# Patient Record
Sex: Male | Born: 1965 | Hispanic: No | Marital: Married | Smoking: Never smoker
Health system: Southern US, Community
[De-identification: ages and names within clinical notes are randomized; demographics above are authoritative.]

## PROBLEM LIST (undated history)

## (undated) DIAGNOSIS — E7800 Pure hypercholesterolemia, unspecified: Secondary | ICD-10-CM

## (undated) DIAGNOSIS — I219 Acute myocardial infarction, unspecified: Secondary | ICD-10-CM

## (undated) HISTORY — PX: CYST EXCISION: SHX5701

## (undated) HISTORY — PX: FINGER SURGERY: SHX640

---

## 2017-05-07 ENCOUNTER — Inpatient Hospital Stay (HOSPITAL_COMMUNITY)
Admission: EM | Admit: 2017-05-07 | Discharge: 2017-05-13 | DRG: 603 | Disposition: A | Discharge status: Home / Self Care | Payer: PRIVATE HEALTH INSURANCE | Attending: Family Medicine | Admitting: Family Medicine

## 2017-05-07 ENCOUNTER — Encounter (HOSPITAL_COMMUNITY): Payer: Self-pay | Admitting: *Deleted

## 2017-05-07 ENCOUNTER — Inpatient Hospital Stay (HOSPITAL_COMMUNITY): Payer: Self-pay

## 2017-05-07 DIAGNOSIS — L03116 Cellulitis of left lower limb: Principal | ICD-10-CM

## 2017-05-07 DIAGNOSIS — E669 Obesity, unspecified: Secondary | ICD-10-CM | POA: Diagnosis present

## 2017-05-07 DIAGNOSIS — E785 Hyperlipidemia, unspecified: Secondary | ICD-10-CM | POA: Diagnosis present

## 2017-05-07 DIAGNOSIS — R000 Tachycardia, unspecified: Secondary | ICD-10-CM | POA: Diagnosis not present

## 2017-05-07 DIAGNOSIS — R509 Fever, unspecified: Secondary | ICD-10-CM | POA: Diagnosis present

## 2017-05-07 DIAGNOSIS — I252 Old myocardial infarction: Secondary | ICD-10-CM

## 2017-05-07 DIAGNOSIS — Z7984 Long term (current) use of oral hypoglycemic drugs: Secondary | ICD-10-CM

## 2017-05-07 DIAGNOSIS — R600 Localized edema: Secondary | ICD-10-CM

## 2017-05-07 DIAGNOSIS — I2510 Atherosclerotic heart disease of native coronary artery without angina pectoris: Secondary | ICD-10-CM | POA: Diagnosis not present

## 2017-05-07 DIAGNOSIS — L03119 Cellulitis of unspecified part of limb: Secondary | ICD-10-CM | POA: Diagnosis present

## 2017-05-07 DIAGNOSIS — Z6841 Body Mass Index (BMI) 40.0 and over, adult: Secondary | ICD-10-CM

## 2017-05-07 DIAGNOSIS — M545 Low back pain: Secondary | ICD-10-CM | POA: Diagnosis present

## 2017-05-07 DIAGNOSIS — E119 Type 2 diabetes mellitus without complications: Secondary | ICD-10-CM | POA: Diagnosis present

## 2017-05-07 DIAGNOSIS — Z79899 Other long term (current) drug therapy: Secondary | ICD-10-CM

## 2017-05-07 DIAGNOSIS — M79605 Pain in left leg: Secondary | ICD-10-CM

## 2017-05-07 HISTORY — DX: Pure hypercholesterolemia, unspecified: E78.00

## 2017-05-07 HISTORY — DX: Acute myocardial infarction, unspecified: I21.9

## 2017-05-07 LAB — CBC WITH DIFFERENTIAL/PLATELET
Basophils Absolute: 0 10*3/uL (ref 0.0–0.1)
Basophils Relative: 0 %
EOS ABS: 0 10*3/uL (ref 0.0–0.7)
Eosinophils Relative: 0 %
HEMATOCRIT: 39.3 % (ref 39.0–52.0)
HEMOGLOBIN: 13.2 g/dL (ref 13.0–17.0)
LYMPHS ABS: 0.8 10*3/uL (ref 0.7–4.0)
Lymphocytes Relative: 8 %
MCH: 28.8 pg (ref 26.0–34.0)
MCHC: 33.6 g/dL (ref 30.0–36.0)
MCV: 85.6 fL (ref 78.0–100.0)
MONO ABS: 0.4 10*3/uL (ref 0.1–1.0)
MONOS PCT: 4 %
NEUTROS PCT: 88 %
Neutro Abs: 9.3 10*3/uL — ABNORMAL HIGH (ref 1.7–7.7)
Platelets: 151 10*3/uL (ref 150–400)
RBC: 4.59 MIL/uL (ref 4.22–5.81)
RDW: 13.9 % (ref 11.5–15.5)
WBC: 10.5 10*3/uL (ref 4.0–10.5)

## 2017-05-07 LAB — COMPREHENSIVE METABOLIC PANEL
ALK PHOS: 60 U/L (ref 38–126)
ALT: 40 U/L (ref 17–63)
ANION GAP: 9 (ref 5–15)
AST: 42 U/L — ABNORMAL HIGH (ref 15–41)
Albumin: 3.2 g/dL — ABNORMAL LOW (ref 3.5–5.0)
BILIRUBIN TOTAL: 1.2 mg/dL (ref 0.3–1.2)
BUN: 15 mg/dL (ref 6–20)
CALCIUM: 8.2 mg/dL — AB (ref 8.9–10.3)
CO2: 25 mmol/L (ref 22–32)
Chloride: 99 mmol/L — ABNORMAL LOW (ref 101–111)
Creatinine, Ser: 1.02 mg/dL (ref 0.61–1.24)
GFR calc non Af Amer: >60 mL/min (ref >60)
Glucose, Bld: 136 mg/dL — ABNORMAL HIGH (ref 65–99)
POTASSIUM: 3.5 mmol/L (ref 3.5–5.1)
SODIUM: 133 mmol/L — AB (ref 135–145)
TOTAL PROTEIN: 7.1 g/dL (ref 6.5–8.1)

## 2017-05-07 LAB — GLUCOSE, CAPILLARY
Glucose-Capillary: 111 mg/dL — ABNORMAL HIGH (ref 65–99)
Glucose-Capillary: 124 mg/dL — ABNORMAL HIGH (ref 65–99)

## 2017-05-07 LAB — LACTIC ACID, PLASMA: Lactic Acid, Venous: 1.6 mmol/L (ref 0.5–1.9)

## 2017-05-07 LAB — D-DIMER, QUANTITATIVE: D-Dimer, Quant: 3 ug/mL-FEU — ABNORMAL HIGH (ref 0.00–0.50)

## 2017-05-07 MED ORDER — DOCUSATE SODIUM 100 MG PO CAPS
100.0000 mg | ORAL_CAPSULE | Freq: Two times a day (BID) | ORAL | Status: DC
  Administered 2017-05-07 – 2017-05-13 (×12): 100 mg via ORAL
  Filled 2017-05-07 (×12): qty 1

## 2017-05-07 MED ORDER — POTASSIUM CHLORIDE CRYS ER 20 MEQ PO TBCR
20.0000 meq | EXTENDED_RELEASE_TABLET | Freq: Two times a day (BID) | ORAL | Status: DC
  Administered 2017-05-07 – 2017-05-11 (×8): 20 meq via ORAL
  Filled 2017-05-07 (×9): qty 1

## 2017-05-07 MED ORDER — ACETAMINOPHEN 325 MG PO TABS
650.0000 mg | ORAL_TABLET | Freq: Four times a day (QID) | ORAL | Status: DC | PRN
Start: 2017-05-07 — End: 2017-05-13

## 2017-05-07 MED ORDER — ACETAMINOPHEN 325 MG PO TABS
650.0000 mg | ORAL_TABLET | Freq: Once | ORAL | Status: AC
  Administered 2017-05-07: 650 mg via ORAL
  Filled 2017-05-07: qty 2

## 2017-05-07 MED ORDER — INSULIN ASPART 100 UNIT/ML ~~LOC~~ SOLN
0.0000 [IU] | Freq: Three times a day (TID) | SUBCUTANEOUS | Status: DC
  Administered 2017-05-08 – 2017-05-11 (×10): 3 [IU] via SUBCUTANEOUS
  Administered 2017-05-11 – 2017-05-12 (×2): 4 [IU] via SUBCUTANEOUS
  Administered 2017-05-12: 3 [IU] via SUBCUTANEOUS

## 2017-05-07 MED ORDER — FUROSEMIDE 20 MG PO TABS
20.0000 mg | ORAL_TABLET | Freq: Two times a day (BID) | ORAL | Status: DC
  Administered 2017-05-08 – 2017-05-11 (×8): 20 mg via ORAL
  Filled 2017-05-07 (×8): qty 1

## 2017-05-07 MED ORDER — PANTOPRAZOLE SODIUM 40 MG PO TBEC
40.0000 mg | DELAYED_RELEASE_TABLET | Freq: Two times a day (BID) | ORAL | Status: DC
  Administered 2017-05-07 – 2017-05-13 (×12): 40 mg via ORAL
  Filled 2017-05-07 (×12): qty 1

## 2017-05-07 MED ORDER — OXYCODONE HCL 5 MG PO TABS
10.0000 mg | ORAL_TABLET | Freq: Once | ORAL | Status: AC
  Administered 2017-05-07: 10 mg via ORAL
  Filled 2017-05-07: qty 2

## 2017-05-07 MED ORDER — METOLAZONE 5 MG PO TABS
10.0000 mg | ORAL_TABLET | ORAL | Status: DC
  Administered 2017-05-07 – 2017-05-12 (×3): 10 mg via ORAL
  Filled 2017-05-07 (×3): qty 2
  Filled 2017-05-07: qty 1
  Filled 2017-05-07: qty 2

## 2017-05-07 MED ORDER — ONDANSETRON HCL 4 MG PO TABS: 4.0000 mg | ORAL_TABLET | Freq: Four times a day (QID) | ORAL | Status: DC | PRN

## 2017-05-07 MED ORDER — ACETAMINOPHEN 650 MG RE SUPP: 650.0000 mg | Freq: Four times a day (QID) | RECTAL | Status: DC | PRN

## 2017-05-07 MED ORDER — INSULIN ASPART 100 UNIT/ML ~~LOC~~ SOLN
6.0000 [IU] | Freq: Three times a day (TID) | SUBCUTANEOUS | Status: DC
Start: 2017-05-07 — End: 2017-05-13
  Administered 2017-05-07 – 2017-05-13 (×17): 6 [IU] via SUBCUTANEOUS

## 2017-05-07 MED ORDER — ENOXAPARIN SODIUM 80 MG/0.8ML ~~LOC~~ SOLN
80.0000 mg | SUBCUTANEOUS | Status: DC
  Administered 2017-05-07 – 2017-05-11 (×5): 80 mg via SUBCUTANEOUS
  Filled 2017-05-07 (×5): qty 0.8

## 2017-05-07 MED ORDER — CEFAZOLIN SODIUM-DEXTROSE 2-4 GM/100ML-% IV SOLN
2.0000 g | Freq: Three times a day (TID) | INTRAVENOUS | Status: DC
  Administered 2017-05-07 – 2017-05-08 (×2): 2 g via INTRAVENOUS
  Filled 2017-05-07 (×3): qty 100

## 2017-05-07 MED ORDER — HYDROCODONE-ACETAMINOPHEN 5-325 MG PO TABS
1.0000 | ORAL_TABLET | ORAL | Status: DC | PRN
  Administered 2017-05-08 – 2017-05-11 (×11): 1 via ORAL
  Filled 2017-05-07 (×10): qty 1
  Filled 2017-05-07: qty 2

## 2017-05-07 MED ORDER — NAPROXEN 250 MG PO TABS
500.0000 mg | ORAL_TABLET | Freq: Two times a day (BID) | ORAL | Status: AC
  Administered 2017-05-07 – 2017-05-10 (×6): 500 mg via ORAL
  Filled 2017-05-07 (×6): qty 2

## 2017-05-07 MED ORDER — PRAVASTATIN SODIUM 40 MG PO TABS
20.0000 mg | ORAL_TABLET | Freq: Every day | ORAL | Status: DC
  Administered 2017-05-07 – 2017-05-12 (×6): 20 mg via ORAL
  Filled 2017-05-07 (×6): qty 1

## 2017-05-07 MED ORDER — LIDOCAINE 5 % EX PTCH
2.0000 | MEDICATED_PATCH | CUTANEOUS | Status: DC
  Filled 2017-05-07: qty 2

## 2017-05-07 MED ORDER — VANCOMYCIN HCL IN DEXTROSE 1-5 GM/200ML-% IV SOLN
1000.0000 mg | Freq: Once | INTRAVENOUS | Status: AC
  Administered 2017-05-07: 1000 mg via INTRAVENOUS
  Filled 2017-05-07: qty 200

## 2017-05-07 MED ORDER — SODIUM CHLORIDE 0.9% FLUSH
3.0000 mL | Freq: Two times a day (BID) | INTRAVENOUS | Status: DC
  Administered 2017-05-08 – 2017-05-12 (×10): 3 mL via INTRAVENOUS

## 2017-05-07 MED ORDER — ONDANSETRON HCL 4 MG/2ML IJ SOLN: 4.0000 mg | Freq: Four times a day (QID) | INTRAMUSCULAR | Status: DC | PRN

## 2017-05-07 MED ORDER — SODIUM CHLORIDE 0.9% FLUSH: 3.0000 mL | INTRAVENOUS | Status: DC | PRN

## 2017-05-07 MED ORDER — SODIUM CHLORIDE 0.9 % IV BOLUS (SEPSIS)
1000.0000 mL | Freq: Once | INTRAVENOUS | Status: AC
  Administered 2017-05-07: 1000 mL via INTRAVENOUS

## 2017-05-07 MED ORDER — SODIUM CHLORIDE 0.9 % IV SOLN
250.0000 mL | INTRAVENOUS | Status: DC | PRN
  Administered 2017-05-08: 250 mL via INTRAVENOUS

## 2017-05-07 MED ORDER — LISINOPRIL 5 MG PO TABS
5.0000 mg | ORAL_TABLET | Freq: Every day | ORAL | Status: DC
  Administered 2017-05-08 – 2017-05-13 (×6): 5 mg via ORAL
  Filled 2017-05-07 (×6): qty 1

## 2017-05-07 MED ORDER — FUROSEMIDE 40 MG PO TABS: 20.0000 mg | ORAL_TABLET | Freq: Two times a day (BID) | ORAL | Status: DC

## 2017-05-07 MED ORDER — ZOLPIDEM TARTRATE 5 MG PO TABS: 5.0000 mg | ORAL_TABLET | Freq: Every evening | ORAL | Status: DC | PRN

## 2017-05-07 MED ORDER — LIDOCAINE 5 % EX PTCH
2.0000 | MEDICATED_PATCH | CUTANEOUS | Status: DC
  Administered 2017-05-07: 2 via TRANSDERMAL
  Filled 2017-05-07 (×3): qty 2

## 2017-05-07 MED ORDER — CARVEDILOL 3.125 MG PO TABS
3.1250 mg | ORAL_TABLET | Freq: Two times a day (BID) | ORAL | Status: DC
  Administered 2017-05-07 – 2017-05-13 (×12): 3.125 mg via ORAL
  Filled 2017-05-07 (×12): qty 1

## 2017-05-07 NOTE — ED Notes (Signed)
hospitalist in room  

## 2017-05-07 NOTE — H&P (Signed)
History and Physical  Barry Henry DOB: 02/28/1966 DOA: 05/07/2017  Referring physician: Estell HarpinZammit, MD PCP: Patient, No Pcp Per   Chief Complaint: Fever   HPI: Barry Henry is a 51 y.o. male with type 2 diabetes mellitus who reports that 3 days ago he started having fever and chills.  He reports that he went to the emergency department at another facility and had a workup including a CTA chest that was negative for PE.  He reports that he was discharged home.  The patient reports that he continued to have fever and chills.  The patient reports that yesterday he noticed increasing swelling and erythema of his left lower extremity.  He was brought to the emergency department at any pin and noted to have a bright red swollen left lower extremity.  He was noted to have fever.  The patient was started on IV antibiotic therapy and hospital admission was requested for treatment of left lower extremity cellulitis.  The patient reports that he has been laying on the bed at home for the past 3 days because of malaise.  He reports symptoms of low back pain related to lying flat in bed for the past several days.  He denies loss of bowel or bladder function or control.  Review of Systems: All systems reviewed and apart from history of presenting illness, are negative.  Past Medical History:  Diagnosis Date  . Heart attack (HCC)   . High cholesterol    Past Surgical History:  Procedure Laterality Date  . CYST EXCISION Right    head  . FINGER SURGERY Right    Social History:  reports that he has never smoked. He has never used smokeless tobacco. He reports that he does not drink alcohol or use drugs.  Allergies  Allergen Reactions  . Robaxin [Methocarbamol] Other (See Comments)    Pt reports "it makes my heart stop beating"    History reviewed. No pertinent family history.  Prior to Admission medications   Medication Sig Start Date End Date Taking? Authorizing Provider    carvedilol (COREG) 3.125 MG tablet Take 3.125 mg by mouth 2 (two) times daily with a meal.   Yes [provider]  furosemide (LASIX) 20 MG tablet Take 20 mg by mouth 2 (two) times daily.   Yes [provider]  lisinopril (PRINIVIL,ZESTRIL) 5 MG tablet Take 5 mg by mouth daily.   Yes [provider]  lovastatin (MEVACOR) 10 MG tablet Take 10 mg by mouth at bedtime.   Yes [provider]  metFORMIN (GLUCOPHAGE) 500 MG tablet Take 500 mg by mouth 2 (two) times daily with a meal.   Yes [provider]  metolazone (ZAROXOLYN) 10 MG tablet Take 10 mg by mouth daily. take on mon, wed,fridays   Yes [provider]  potassium chloride SA (K-DUR,KLOR-CON) 20 MEQ tablet Take 20 mEq by mouth 2 (two) times daily.   Yes [provider]   Physical Exam: Vitals:   05/07/17 1309  BP: 107/71  Pulse: (!) 104  Resp: 20  Temp: 98 F (36.7 C)  TempSrc: Oral  SpO2: 96%  Weight: (!) 172.4 kg (380 lb)  Height: 5\' 8"  (1.727 m)     General exam: morbidly obese male patient, lying supine on the gurney in no obvious distress. He is cooperative and pleasant.   Head, eyes and ENT: Nontraumatic and normocephalic. Pupils equally reacting to light and accommodation. Oral mucosa dry.  Neck: Supple. No JVD, carotid  bruit or thyromegaly.  Lymphatics: No lymphadenopathy.  Respiratory system: Clear to auscultation. No increased work of breathing.  Cardiovascular system: S1 and S2 heard, tachycardic. Distant heart sounds due to body habitus.   Gastrointestinal system: Abdomen is nondistended, soft and nontender. Normal bowel sounds heard. No organomegaly or masses appreciated.  Central nervous system: Alert and oriented. No focal neurological deficits.  Extremities: bright red, hot, swollen left lower leg from ankle to knee (see photos), no fluctuance of purulence appreciated  Skin: LLE cellulitis as noted above  Musculoskeletal system: see photos  below  Psychiatry: Pleasant and cooperative.      Labs on Admission:  Basic Metabolic Panel:  Recent Labs Lab 05/07/17 1336  NA 133*  K 3.5  CL 99*  CO2 25  GLUCOSE 136*  BUN 15  CREATININE 1.02  CALCIUM 8.2*   Liver Function Tests:  Recent Labs Lab 05/07/17 1336  AST 42*  ALT 40  ALKPHOS 60  BILITOT 1.2  PROT 7.1  ALBUMIN 3.2*   No results for input(s): LIPASE, AMYLASE in the last 168 hours. No results for input(s): AMMONIA in the last 168 hours. CBC:  Recent Labs Lab 05/07/17 1336  WBC 10.5  NEUTROABS 9.3*  HGB 13.2  HCT 39.3  MCV 85.6  PLT 151   Cardiac Enzymes: No results for input(s): CKTOTAL, CKMB, CKMBINDEX, TROPONINI in the last 168 hours.  BNP (last 3 results) No results for input(s): PROBNP in the last 8760 hours. CBG: No results for input(s): GLUCAP in the last 168 hours.  Radiological Exams on Admission: No results found.  EKG: Independently reviewed.   Assessment/Plan Principal Problem:   Cellulitis, leg Active Problems:   Fever and chills   Sinus tachycardia   CAD (coronary artery disease)   Obesity   Dyslipidemia   1. Acute cellulitis of the left lower extremity-IV antibiotics started, vancomycin IV given in the emergency department, order set started and IV cefazolin ordered 2 g every 8 hours, check a lactic acid level, follow CBC with differential, monitor clinically, supportive therapy as needed.  Ultrasound of the left lower extremity negative for acute DVT findings.  Blood culture x 2 obtained. Elevate extremity ordered.   2. Fever and chills secondary to above, checking lactic acid level. Tylenol ordered for fever, naproxen ordered.  3. SIRS - secondary to above, continue supportive therapy and follow.  4. Type 2 diabetes mellitus - hold home metformin, provide supplemental sliding scale coverage, monitor blood sugars, prandial novolog ordered as well.  Follow Hg A1c. Carb modified diet ordered.   5. CAD s/p MI -  currently stable, follow. Resume all home cardiac medications.  6. Dyslipidemia - resume home statin medication.  Check fasting lipid panel in AM. Heart healthy diet ordered.  7. Acute low back pain - trial of topical lidocaine, oral naproxen, oxycodone ordered for severe pain. Follow clinically.   8. Morbid obesity - will try to obtain bariatric bed if available.   DVT Prophylaxis: lovenox Code Status: Full   Family Communication: bedside  Disposition Plan: Home in 3-4 days.    Time spent: 58 mins  Standley Dakins, MD [facility] Pager [phone]  If 7PM-7AM, please contact night-coverage [email or web address] Password [email or web address] 05/07/2017, 3:04 PM

## 2017-05-07 NOTE — ED Triage Notes (Signed)
Pt c/o left lower leg redness, warmth, swelling, fever, body aches, chills x 2 days. Fever yesterday of 103.8. Ibuprofen 600mg  last taken at 0700.

## 2017-05-07 NOTE — ED Notes (Signed)
US in with pt. 

## 2017-05-07 NOTE — ED Provider Notes (Signed)
[facility] EMERGENCY DEPARTMENT Provider Note   CSN: [id number] Arrival date & time: 05/07/17  1259     History   Chief Complaint Chief Complaint  Patient presents with  . Leg Swelling    HPI Barry Henry is a 51 y.o. male.  Patient complains of swelling and pain in left leg   The history is provided by the patient.  Rash   This is a new problem. The current episode started 2 days ago. The problem has not changed since onset.The problem is associated with nothing. There has been no fever. Affected Location: Pain in left lower leg. The pain is at a severity of 6/10. The pain is moderate. The pain has been constant since onset. Pertinent negatives include no blisters.    Past Medical History:  Diagnosis Date  . Heart attack (HCC)   . High cholesterol     Patient Active Problem List   Diagnosis Date Noted  . Cellulitis, leg 05/07/2017  . Fever and chills 05/07/2017  . Sinus tachycardia 05/07/2017  . CAD (coronary artery disease) 05/07/2017  . Obesity 05/07/2017  . Dyslipidemia 05/07/2017    Past Surgical History:  Procedure Laterality Date  . CYST EXCISION Right    head  . FINGER SURGERY Right        Home Medications    Prior to Admission medications   Medication Sig Start Date End Date Taking? Authorizing Provider  carvedilol (COREG) 3.125 MG tablet Take 3.125 mg by mouth 2 (two) times daily with a meal.   Yes [provider]  furosemide (LASIX) 20 MG tablet Take 20 mg by mouth 2 (two) times daily.   Yes [provider]  lisinopril (PRINIVIL,ZESTRIL) 5 MG tablet Take 5 mg by mouth daily.   Yes [provider]  lovastatin (MEVACOR) 10 MG tablet Take 10 mg by mouth at bedtime.   Yes [provider]  metFORMIN (GLUCOPHAGE) 500 MG tablet Take 500 mg by mouth 2 (two) times daily with a meal.   Yes [provider]  metolazone (ZAROXOLYN) 10 MG tablet Take 10 mg by mouth daily. take on mon, wed,fridays   Yes  [provider]  potassium chloride SA (K-DUR,KLOR-CON) 20 MEQ tablet Take 20 mEq by mouth 2 (two) times daily.   Yes [provider]    Family History History reviewed. No pertinent family history.  Social History Social History  Substance Use Topics  . Smoking status: Never Smoker  . Smokeless tobacco: Never Used  . Alcohol use No     Allergies   Robaxin [methocarbamol]   Review of Systems Review of Systems  Constitutional: Negative for appetite change and fatigue.  HENT: Negative for congestion, ear discharge and sinus pressure.   Eyes: Negative for discharge.  Respiratory: Negative for cough.   Cardiovascular: Negative for chest pain.  Gastrointestinal: Negative for abdominal pain and diarrhea.  Genitourinary: Negative for frequency and hematuria.  Musculoskeletal: Negative for back pain.  Skin: Positive for rash.       Left lower leg rash  Neurological: Negative for seizures and headaches.  Psychiatric/Behavioral: Negative for hallucinations.     Physical Exam Updated Vital Signs BP 107/71   Pulse (!) 104   Temp 98 F (36.7 C) (Oral)   Resp 20   Ht 5\' 8"  (1.727 m)   Wt (!) 172.4 kg (380 lb)   SpO2 96%   BMI 57.78 kg/m   Physical Exam  Constitutional: He is oriented to person, place, and time.  He appears well-developed.  HENT:  Head: Normocephalic.  Eyes: Conjunctivae and EOM are normal. No scleral icterus.  Neck: Neck supple. No thyromegaly present.  Cardiovascular: Normal rate and regular rhythm.  Exam reveals no gallop and no friction rub.   No murmur heard. Pulmonary/Chest: No stridor. He has no wheezes. He has no rales. He exhibits no tenderness.  Abdominal: He exhibits no distension. There is no tenderness. There is no rebound.  Musculoskeletal: Normal range of motion. He exhibits no edema.  Patient has redness tenderness swelling left lower leg consistent with cellulitis  Lymphadenopathy:    He has no cervical adenopathy.    Neurological: He is oriented to person, place, and time. He exhibits normal muscle tone. Coordination normal.  Skin: No rash noted. No erythema.  Psychiatric: He has a normal mood and affect. His behavior is normal.     ED Treatments / Results  Labs (all labs ordered are listed, but only abnormal results are displayed) Labs Reviewed  CBC WITH DIFFERENTIAL/PLATELET - Abnormal; Notable for the following:       Result Value   Neutro Abs 9.3 (*)    All other components within normal limits  COMPREHENSIVE METABOLIC PANEL - Abnormal; Notable for the following:    Sodium 133 (*)    Chloride 99 (*)    Glucose, Bld 136 (*)    Calcium 8.2 (*)    Albumin 3.2 (*)    AST 42 (*)    All other components within normal limits  CULTURE, BLOOD (ROUTINE X 2)  CULTURE, BLOOD (ROUTINE X 2)  D-DIMER, QUANTITATIVE (NOT AT [facility])  C-REACTIVE PROTEIN  HEMOGLOBIN A1C    EKG  EKG Interpretation None       Radiology No results found.  Procedures Procedures (including critical care time)  Medications Ordered in ED Medications  vancomycin (VANCOCIN) IVPB 1000 mg/200 mL premix (1,000 mg Intravenous New Bag/Given 05/07/17 1410)     Initial Impression / Assessment and Plan / ED Course  I have reviewed the triage vital signs and the nursing notes.  Pertinent labs & imaging results that were available during my care of the patient were reviewed by me and considered in my medical decision making (see chart for details).     Patient with cellulitis left lower leg will be admitted for IV antibiotics  Final Clinical Impressions(s) / ED Diagnoses   Final diagnoses:  Cellulitis of lower leg    New Prescriptions New Prescriptions   No medications on file     Bethann Berkshire, MD 05/07/17 1511

## 2017-05-08 DIAGNOSIS — R509 Fever, unspecified: Secondary | ICD-10-CM | POA: Diagnosis not present

## 2017-05-08 DIAGNOSIS — I2510 Atherosclerotic heart disease of native coronary artery without angina pectoris: Secondary | ICD-10-CM | POA: Diagnosis not present

## 2017-05-08 DIAGNOSIS — L03116 Cellulitis of left lower limb: Secondary | ICD-10-CM | POA: Diagnosis not present

## 2017-05-08 DIAGNOSIS — E785 Hyperlipidemia, unspecified: Secondary | ICD-10-CM | POA: Diagnosis not present

## 2017-05-08 LAB — GLUCOSE, CAPILLARY
Glucose-Capillary: 129 mg/dL — ABNORMAL HIGH (ref 65–99)
Glucose-Capillary: 122 mg/dL — ABNORMAL HIGH (ref 65–99)
Glucose-Capillary: 142 mg/dL — ABNORMAL HIGH (ref 65–99)
Glucose-Capillary: 137 mg/dL — ABNORMAL HIGH (ref 65–99)

## 2017-05-08 LAB — LIPID PANEL
CHOL/HDL RATIO: 7.9 ratio
CHOLESTEROL: 111 mg/dL (ref 0–200)
HDL: 14 mg/dL — ABNORMAL LOW (ref >40)
LDL Cholesterol: 65 mg/dL (ref 0–99)
TRIGLYCERIDES: 162 mg/dL — AB (ref <150)
VLDL: 32 mg/dL (ref 0–40)

## 2017-05-08 LAB — COMPREHENSIVE METABOLIC PANEL
ALK PHOS: 55 U/L (ref 38–126)
ALT: 33 U/L (ref 17–63)
ANION GAP: 9 (ref 5–15)
AST: 31 U/L (ref 15–41)
Albumin: 2.7 g/dL — ABNORMAL LOW (ref 3.5–5.0)
BUN: 13 mg/dL (ref 6–20)
CALCIUM: 8.1 mg/dL — AB (ref 8.9–10.3)
CO2: 24 mmol/L (ref 22–32)
Chloride: 99 mmol/L — ABNORMAL LOW (ref 101–111)
Creatinine, Ser: 0.83 mg/dL (ref 0.61–1.24)
GFR calc non Af Amer: >60 mL/min (ref >60)
Glucose, Bld: 130 mg/dL — ABNORMAL HIGH (ref 65–99)
Potassium: 3.2 mmol/L — ABNORMAL LOW (ref 3.5–5.1)
SODIUM: 132 mmol/L — AB (ref 135–145)
TOTAL PROTEIN: 6.2 g/dL — AB (ref 6.5–8.1)
Total Bilirubin: 1.1 mg/dL (ref 0.3–1.2)

## 2017-05-08 LAB — CBC WITH DIFFERENTIAL/PLATELET
BASOS ABS: 0 10*3/uL (ref 0.0–0.1)
BASOS PCT: 0 %
EOS ABS: 0.1 10*3/uL (ref 0.0–0.7)
Eosinophils Relative: 2 %
HCT: 35 % — ABNORMAL LOW (ref 39.0–52.0)
HEMOGLOBIN: 11.7 g/dL — AB (ref 13.0–17.0)
Lymphocytes Relative: 13 %
Lymphs Abs: 0.8 10*3/uL (ref 0.7–4.0)
MCH: 28.4 pg (ref 26.0–34.0)
MCHC: 33.4 g/dL (ref 30.0–36.0)
MCV: 85 fL (ref 78.0–100.0)
Monocytes Absolute: 0.5 10*3/uL (ref 0.1–1.0)
Monocytes Relative: 8 %
NEUTROS PCT: 77 %
Neutro Abs: 4.8 10*3/uL (ref 1.7–7.7)
Platelets: 140 10*3/uL — ABNORMAL LOW (ref 150–400)
RBC: 4.12 MIL/uL — AB (ref 4.22–5.81)
RDW: 13.6 % (ref 11.5–15.5)
WBC: 6.2 10*3/uL (ref 4.0–10.5)

## 2017-05-08 LAB — MAGNESIUM: Magnesium: 1.7 mg/dL (ref 1.7–2.4)

## 2017-05-08 LAB — C-REACTIVE PROTEIN: CRP: 30.8 mg/dL — ABNORMAL HIGH (ref <1.0)

## 2017-05-08 LAB — HIV ANTIBODY (ROUTINE TESTING W REFLEX): HIV Screen 4th Generation wRfx: NONREACTIVE

## 2017-05-08 MED ORDER — POTASSIUM CHLORIDE CRYS ER 20 MEQ PO TBCR
40.0000 meq | EXTENDED_RELEASE_TABLET | Freq: Once | ORAL | Status: AC
  Administered 2017-05-08: 40 meq via ORAL
  Filled 2017-05-08: qty 2

## 2017-05-08 MED ORDER — MAGNESIUM SULFATE 2 GM/50ML IV SOLN
2.0000 g | Freq: Once | INTRAVENOUS | Status: AC
  Administered 2017-05-08: 2 g via INTRAVENOUS
  Filled 2017-05-08: qty 50

## 2017-05-08 MED ORDER — VANCOMYCIN HCL 10 G IV SOLR
1250.0000 mg | Freq: Two times a day (BID) | INTRAVENOUS | Status: DC
  Administered 2017-05-08 – 2017-05-11 (×8): 1250 mg via INTRAVENOUS
  Filled 2017-05-08 (×9): qty 1250

## 2017-05-08 MED ORDER — SODIUM CHLORIDE 0.9 % IV SOLN
1.0000 g | Freq: Once | INTRAVENOUS | Status: AC
  Administered 2017-05-08: 1 g via INTRAVENOUS
  Filled 2017-05-08: qty 1

## 2017-05-08 MED ORDER — SODIUM CHLORIDE 0.9 % IV SOLN
1.0000 g | Freq: Three times a day (TID) | INTRAVENOUS | Status: DC
  Administered 2017-05-08 – 2017-05-12 (×12): 1 g via INTRAVENOUS
  Filled 2017-05-08 (×14): qty 1

## 2017-05-08 NOTE — Progress Notes (Signed)
PROGRESS NOTE   Barry Henry  NWG:[id number]  DOB: 05/28/1966  DOA: 05/07/2017 PCP: Patient, No Pcp Per  Brief Admission Hx:  Barry Henry is a 51 y.o. male with type 2 diabetes mellitus who reports that 3 days ago he started having fever and chills. He was admitted for cellulitis of the LLE.    MDM/Assessment & Plan:   1. Acute cellulitis of the left lower extremity-Increasing heat, redness, swelling and pain noted, it has become much more severe, will broaden antibiotic coverage per cellulitis order set, will add meropenem and vancomycin, DC IV cefazolin.   Ultrasound of the left lower extremity negative for acute DVT findings.  Blood culture x 2 obtained NGTD. Elevate extremity ordered.   2. Fever and chills secondary to above,  lactic acid level normal.  Tylenol ordered for fever, naproxen ordered.  3. SIRS - resolved now.  secondary to above, continue supportive therapy and follow.  4. Type 2 diabetes mellitus - hold home metformin, provide supplemental sliding scale coverage, monitor blood sugars, prandial novolog ordered as well.  Follow Hg A1c. Carb modified diet ordered.   5. CAD s/p MI - currently stable, follow. Resume all home cardiac medications.  6. Dyslipidemia - resume home statin medication.   lipid panel suggests control of disease.  Heart healthy diet ordered.  7. Acute low back pain - trial of topical lidocaine, oral naproxen, oxycodone ordered for severe pain. Follow clinically.   8. Morbid obesity - will try to obtain bariatric bed if available.   DVT Prophylaxis: lovenox Code Status: Full   Family Communication: bedside  Disposition Plan: Home when medically ready.     Subjective: Pt having increased pain, swelling and erythema in left lower extremity.   Objective: Vitals:   05/07/17 1539 05/07/17 2042 05/07/17 2101 05/08/17 0603  BP: 113/76  (!) 95/56 108/62  Pulse: (!) 106  85 90  Resp:   20 15  Temp:   98.9 F (37.2 C) 98.4 F (36.9 C)  TempSrc:    Oral Oral  SpO2: 97% 96% 96% 98%  Weight:      Height:        Intake/Output Summary (Last 24 hours) at 05/08/17 [id number] Last data filed at 05/08/17 [id number]  Gross per 24 hour  Intake              400 ml  Output                0 ml  Net              400 ml   Filed Weights   05/07/17 1309  Weight: (!) 172.4 kg (380 lb)     REVIEW OF SYSTEMS  As per history otherwise all reviewed and reported negative  Exam:  General exam: morbidly obese male, NAD. Cooperative.  Respiratory system: Clear. No increased work of breathing. Cardiovascular system: S1 & S2 heard, RRR.  Gastrointestinal system: Abdomen is nondistended, soft and nontender. Normal bowel sounds heard. Central nervous system: Alert and oriented. No focal neurological deficits. Extremities: LLE with increased erythema, rubor, seems to be spreading proximally up left leg into thigh from yesterday, very hot to touch, no fluctuance or purulence appreciated.   Data Reviewed: Basic Metabolic Panel:  Recent Labs Lab 05/07/17 1336 05/08/17 0612  NA 133* 132*  K 3.5 3.2*  CL 99* 99*  CO2 25 24  GLUCOSE 136* 130*  BUN 15 13  CREATININE 1.02 0.83  CALCIUM 8.2* 8.1*  MG  --  1.7   Liver Function Tests:  Recent Labs Lab 05/07/17 1336 05/08/17 0612  AST 42* 31  ALT 40 33  ALKPHOS 60 55  BILITOT 1.2 1.1  PROT 7.1 6.2*  ALBUMIN 3.2* 2.7*   No results for input(s): LIPASE, AMYLASE in the last 168 hours. No results for input(s): AMMONIA in the last 168 hours. CBC:  Recent Labs Lab 05/07/17 1336 05/08/17 0612  WBC 10.5 6.2  NEUTROABS 9.3* 4.8  HGB 13.2 11.7*  HCT 39.3 35.0*  MCV 85.6 85.0  PLT 151 140*   Cardiac Enzymes: No results for input(s): CKTOTAL, CKMB, CKMBINDEX, TROPONINI in the last 168 hours. CBG (last 3)   Recent Labs  05/07/17 1738 05/07/17 2055 05/08/17 0735  GLUCAP 111* 124* 129*   Recent Results (from the past 240 hour(s))  Blood culture (routine x 2)     Status: None (Preliminary  result)   Collection Time: 05/07/17  1:37 PM  Result Value Ref Range Status   Specimen Description BLOOD  Final   Special Requests BLOOD  Final   Culture NO GROWTH < 24 HOURS  Final   Report Status PENDING  Incomplete  Blood culture (routine x 2)     Status: None (Preliminary result)   Collection Time: 05/07/17  1:41 PM  Result Value Ref Range Status   Specimen Description BLOOD  Final   Special Requests BLOOD  Final   Culture NO GROWTH < 24 HOURS  Final   Report Status PENDING  Incomplete     Studies: US Venous Img Lower Bilateral  Result Date: 05/07/2017 CLINICAL DATA:  Left lower extremity redness and edema EXAM: BILATERAL LOWER EXTREMITY VENOUS DOPPLER ULTRASOUND TECHNIQUE: Gray-scale sonography with graded compression, as well as color Doppler and duplex ultrasound were performed to evaluate the lower extremity deep venous systems from the level of the common femoral vein and including the common femoral, femoral, profunda femoral, popliteal and calf veins including the posterior tibial, peroneal and gastrocnemius veins when visible. The superficial great saphenous vein was also interrogated. Spectral Doppler was utilized to evaluate flow at rest and with distal augmentation maneuvers in the common femoral, femoral and popliteal veins. COMPARISON:  None. FINDINGS: RIGHT LOWER EXTREMITY Common Femoral Vein: No evidence of thrombus. Normal compressibility, respiratory phasicity and response to augmentation. Saphenofemoral Junction: No evidence of thrombus. Normal compressibility and flow on color Doppler imaging. Profunda Femoral Vein: No evidence of thrombus. Normal compressibility and flow on color Doppler imaging. Femoral Vein: No evidence of thrombus. Normal compressibility, respiratory phasicity and response to augmentation. Popliteal Vein: No evidence of thrombus. Normal compressibility, respiratory phasicity and response to augmentation. Calf Veins: No evidence of thrombus. Normal  compressibility and flow on color Doppler imaging. Superficial Great Saphenous Vein: No evidence of thrombus. Normal compressibility. Venous Reflux:  None. Other Findings:  None. LEFT LOWER EXTREMITY Common Femoral Vein: No evidence of thrombus. Normal compressibility, respiratory phasicity and response to augmentation. Saphenofemoral Junction: No evidence of thrombus. Normal compressibility and flow on color Doppler imaging. Profunda Femoral Vein: No evidence of thrombus. Normal compressibility and flow on color Doppler imaging. Femoral Vein: No evidence of thrombus. Normal compressibility, respiratory phasicity and response to augmentation. Popliteal Vein: No evidence of thrombus. Normal compressibility, respiratory phasicity and response to augmentation. Calf Veins: No evidence of thrombus. Normal compressibility and flow on color Doppler imaging. Superficial Great Saphenous Vein: No evidence of thrombus. Normal compressibility. Venous Reflux:  None. Other Findings:  None. IMPRESSION: No evidence of deep venous thrombosis. Electronically Signed  By: Charlett Nose M.D.   On: 05/07/2017 15:49   Scheduled Meds: . carvedilol  3.125 mg Oral BID WC  . docusate sodium  100 mg Oral BID  . enoxaparin (LOVENOX) injection  80 mg Subcutaneous Q24H  . furosemide  20 mg Oral BID  . insulin aspart  0-20 Units Subcutaneous TID WC  . insulin aspart  6 Units Subcutaneous TID WC  . lidocaine  2 patch Transdermal Q24H  . lisinopril  5 mg Oral Daily  . metolazone  10 mg Oral Q M,W,F-1800  . naproxen  500 mg Oral BID WC  . pantoprazole  40 mg Oral BID AC  . potassium chloride SA  20 mEq Oral BID  . potassium chloride  40 mEq Oral Once  . pravastatin  20 mg Oral q1800  . sodium chloride flush  3 mL Intravenous Q12H   Continuous Infusions: . sodium chloride      Principal Problem:   Cellulitis, leg Active Problems:   Fever and chills   Sinus tachycardia   CAD (coronary artery disease)   Obesity    Dyslipidemia  Time spent:   Standley Dakins, MD, FAAFP [facility] Pager [phone]  If 7PM-7AM, please contact night-coverage [email or web address] Password [email or web address] 05/08/2017, 8:22 AM    LOS: 1 day

## 2017-05-08 NOTE — Progress Notes (Signed)
Pharmacy Antibiotic Note  Barry RabonJohn Henry is Henry 51 y.o. male admitted on 05/07/2017 with cellulitis./ fever.   Pharmacy has been consulted for Vancomycin and Meropenem dosing.  Plan: Vancomycin 1250mg  IV q12hrs Meropenem 1gm IV q8h Check vanc trough at steady state Monitor labs, progress, c/s  Height: 5\' 8"  (172.7 cm) Weight: (!) 380 lb (172.4 kg) IBW/kg (Calculated) : 68.4  Temp (24hrs), Avg:99.1 F (37.3 C), Min:98 F (36.7 C), Max:100.9 F (38.3 C)   Recent Labs Lab 05/07/17 1336 05/07/17 1529 05/08/17 0612  WBC 10.5  --  6.2  CREATININE 1.02  --  0.83  LATICACIDVEN  --  1.6  --     Estimated Creatinine Clearance: 160.1 mL/min (by C-G formula based on SCr of 0.83 mg/dL).    Allergies  Allergen Reactions  . Robaxin [Methocarbamol] Other (See Comments)    Pt reports "it makes my heart stop beating"   Antimicrobials this admission: Vancomycin 10/15 >>  Meropenem 10/25 >>  Cefazolin 10/24>10/25 Dose adjustments this admission:  Microbiology results:  BCx: pending  Thank you for allowing pharmacy to be Henry part of this patient's care.  Barry Henry, Barry Henry 05/08/2017 8:37 AM

## 2017-05-09 DIAGNOSIS — R509 Fever, unspecified: Secondary | ICD-10-CM | POA: Diagnosis not present

## 2017-05-09 DIAGNOSIS — L03116 Cellulitis of left lower limb: Secondary | ICD-10-CM | POA: Diagnosis not present

## 2017-05-09 DIAGNOSIS — E785 Hyperlipidemia, unspecified: Secondary | ICD-10-CM | POA: Diagnosis not present

## 2017-05-09 DIAGNOSIS — I2510 Atherosclerotic heart disease of native coronary artery without angina pectoris: Secondary | ICD-10-CM | POA: Diagnosis not present

## 2017-05-09 LAB — CBC WITH DIFFERENTIAL/PLATELET
BASOS ABS: 0 10*3/uL (ref 0.0–0.1)
Basophils Relative: 0 %
EOS PCT: 4 %
Eosinophils Absolute: 0.2 10*3/uL (ref 0.0–0.7)
HEMATOCRIT: 36.5 % — AB (ref 39.0–52.0)
Hemoglobin: 12.2 g/dL — ABNORMAL LOW (ref 13.0–17.0)
LYMPHS PCT: 20 %
Lymphs Abs: 1 10*3/uL (ref 0.7–4.0)
MCH: 28.2 pg (ref 26.0–34.0)
MCHC: 33.4 g/dL (ref 30.0–36.0)
MCV: 84.3 fL (ref 78.0–100.0)
MONO ABS: 0.7 10*3/uL (ref 0.1–1.0)
MONOS PCT: 14 %
NEUTROS ABS: 3.1 10*3/uL (ref 1.7–7.7)
Neutrophils Relative %: 62 %
PLATELETS: 139 10*3/uL — AB (ref 150–400)
RBC: 4.33 MIL/uL (ref 4.22–5.81)
RDW: 13.6 % (ref 11.5–15.5)
WBC: 5 10*3/uL (ref 4.0–10.5)

## 2017-05-09 LAB — COMPREHENSIVE METABOLIC PANEL
ALT: 28 U/L (ref 17–63)
AST: 25 U/L (ref 15–41)
Albumin: 2.7 g/dL — ABNORMAL LOW (ref 3.5–5.0)
Alkaline Phosphatase: 70 U/L (ref 38–126)
Anion gap: 10 (ref 5–15)
BILIRUBIN TOTAL: 1.1 mg/dL (ref 0.3–1.2)
BUN: 15 mg/dL (ref 6–20)
CHLORIDE: 98 mmol/L — AB (ref 101–111)
CO2: 28 mmol/L (ref 22–32)
Calcium: 8.2 mg/dL — ABNORMAL LOW (ref 8.9–10.3)
Creatinine, Ser: 0.9 mg/dL (ref 0.61–1.24)
GFR calc Af Amer: >60 mL/min (ref >60)
GLUCOSE: 124 mg/dL — AB (ref 65–99)
POTASSIUM: 3.3 mmol/L — AB (ref 3.5–5.1)
Sodium: 136 mmol/L (ref 135–145)
TOTAL PROTEIN: 6.4 g/dL — AB (ref 6.5–8.1)

## 2017-05-09 LAB — GLUCOSE, CAPILLARY
Glucose-Capillary: 121 mg/dL — ABNORMAL HIGH (ref 65–99)
Glucose-Capillary: 140 mg/dL — ABNORMAL HIGH (ref 65–99)
Glucose-Capillary: 122 mg/dL — ABNORMAL HIGH (ref 65–99)
Glucose-Capillary: 163 mg/dL — ABNORMAL HIGH (ref 65–99)

## 2017-05-09 LAB — MAGNESIUM: MAGNESIUM: 1.8 mg/dL (ref 1.7–2.4)

## 2017-05-09 LAB — HEMOGLOBIN A1C
Hgb A1c MFr Bld: 6.9 % — ABNORMAL HIGH (ref 4.8–5.6)
MEAN PLASMA GLUCOSE: 151 mg/dL

## 2017-05-09 MED ORDER — POTASSIUM CHLORIDE CRYS ER 20 MEQ PO TBCR
60.0000 meq | EXTENDED_RELEASE_TABLET | Freq: Once | ORAL | Status: AC
  Administered 2017-05-09: 60 meq via ORAL
  Filled 2017-05-09: qty 3

## 2017-05-09 NOTE — Progress Notes (Signed)
PROGRESS NOTE   Barry Henry  ZOX:[id number]  DOB: [birth year]  DOA: 05/07/2017 PCP: Patient, No Pcp Per  Brief Admission Hx:  Barry Henry is a 51 y.o. male with type 2 diabetes mellitus who reports that 3 days ago he started having fever and chills. He was admitted for cellulitis of the LLE.    MDM/Assessment & Plan:   1. Acute cellulitis of the left lower extremity-swelling and pain persists with increased proximal spread, but less redness noted today.  Continue vanc/meropenem.  Ultrasound of the left lower extremity negative for acute DVT findings.  Blood culture x 2 obtained NGTD.  Elevate extremity.   2. Fever and chills secondary to above,  lactic acid level normal.  Tylenol ordered for fever, naproxen ordered.  3. SIRS - resolved now.  secondary to above, continue supportive therapy and follow.  4. Type 2 diabetes mellitus - hold home metformin, provide supplemental sliding scale coverage, monitor blood sugars, prandial novolog ordered as well.  Hg A1c 6.9%.  Carb modified diet ordered.   5. CAD s/p MI - currently stable, follow. Resume all home cardiac medications.  6. Dyslipidemia - resume home statin medication.   lipid panel suggests control of disease.  Heart healthy diet ordered.  7. Acute low back pain - improved with ambulation and bed adjustment topical lidocaine, oral naproxen, oxycodone ordered for severe pain. Follow clinically.   8. Morbid obesity - pt working on weight loss.    DVT Prophylaxis: lovenox Code Status: Full   Family Communication: bedside  Disposition Plan: Home when medically ready.     Subjective: Pt still having a lot of pain when ambulating but less redness, still spreading proximally   Objective: Vitals:   05/08/17 1320 05/08/17 2006 05/08/17 2211 05/09/17 0511  BP:   (!) 102/54 (!) 97/54  Pulse:   83 74  Resp:   18 18  Temp:   98 F (36.7 C) 98.1 F (36.7 C)  TempSrc:   Oral Oral  SpO2: 94% 97% 98% 99%  Weight:      Height:          Intake/Output Summary (Last 24 hours) at 05/09/17 0957 Last data filed at 05/09/17 8119  Gross per 24 hour  Intake           950.17 ml  Output                0 ml  Net           950.17 ml   Filed Weights   05/07/17 1309  Weight: (!) 172.4 kg (380 lb)     REVIEW OF SYSTEMS  As per history otherwise all reviewed and reported negative  Exam:  General exam: morbidly obese male, NAD. Cooperative.  Respiratory system: Clear. No increased work of breathing. Cardiovascular system: S1 & S2 heard, RRR.  Gastrointestinal system: Abdomen is nondistended, soft and nontender. Normal bowel sounds heard. Central nervous system: Alert and oriented. No focal neurological deficits. Extremities: LLE with erythema, rubor, seems to be spreading proximally up left leg into thigh from yesterday, very hot to touch, no fluctuance or purulence appreciated.        Data Reviewed: Basic Metabolic Panel:  Recent Labs Lab 05/07/17 1336 05/08/17 0612 05/09/17 0440  NA 133* 132* 136  K 3.5 3.2* 3.3*  CL 99* 99* 98*  CO2 25 24 28   GLUCOSE 136* 130* 124*  BUN 15 13 15   CREATININE 1.02 0.83 0.90  CALCIUM 8.2* 8.1* 8.2*  MG  --  1.7 1.8   Liver Function Tests:  Recent Labs Lab 05/07/17 1336 05/08/17 0612 05/09/17 0440  AST 42* 31 25  ALT 40 33 28  ALKPHOS 60 55 70  BILITOT 1.2 1.1 1.1  PROT 7.1 6.2* 6.4*  ALBUMIN 3.2* 2.7* 2.7*   No results for input(s): LIPASE, AMYLASE in the last 168 hours. No results for input(s): AMMONIA in the last 168 hours. CBC:  Recent Labs Lab 05/07/17 1336 05/08/17 0612 05/09/17 0440  WBC 10.5 6.2 5.0  NEUTROABS 9.3* 4.8 3.1  HGB 13.2 11.7* 12.2*  HCT 39.3 35.0* 36.5*  MCV 85.6 85.0 84.3  PLT 151 140* 139*   Cardiac Enzymes: No results for input(s): CKTOTAL, CKMB, CKMBINDEX, TROPONINI in the last 168 hours. CBG (last 3)   Recent Labs  05/08/17 1624 05/08/17 2050 05/09/17 0743  GLUCAP 142* 137* 121*   Recent Results (from the past  240 hour(s))  Blood culture (routine x 2)     Status: None (Preliminary result)   Collection Time: 05/07/17  1:37 PM  Result Value Ref Range Status   Specimen Description BLOOD LEFT FOREARM  Final   Special Requests   Final    BOTTLES DRAWN AEROBIC AND ANAEROBIC Blood Culture results may not be optimal due to an inadequate volume of blood received in culture bottles   Culture NO GROWTH 2 DAYS  Final   Report Status PENDING  Incomplete  Blood culture (routine x 2)     Status: None (Preliminary result)   Collection Time: 05/07/17  1:41 PM  Result Value Ref Range Status   Specimen Description BLOOD RIGHT ARM  Final   Special Requests   Final    BOTTLES DRAWN AEROBIC AND ANAEROBIC Blood Culture adequate volume   Culture NO GROWTH 2 DAYS  Final   Report Status PENDING  Incomplete     Studies: [place] Venous Img Lower Bilateral  Result Date: 05/07/2017 CLINICAL DATA:  Left lower extremity redness and edema EXAM: BILATERAL LOWER EXTREMITY VENOUS DOPPLER ULTRASOUND TECHNIQUE: Gray-scale sonography with graded compression, as well as color Doppler and duplex ultrasound were performed to evaluate the lower extremity deep venous systems from the level of the common femoral vein and including the common femoral, femoral, profunda femoral, popliteal and calf veins including the posterior tibial, peroneal and gastrocnemius veins when visible. The superficial great saphenous vein was also interrogated. Spectral Doppler was utilized to evaluate flow at rest and with distal augmentation maneuvers in the common femoral, femoral and popliteal veins. COMPARISON:  None. FINDINGS: RIGHT LOWER EXTREMITY Common Femoral Vein: No evidence of thrombus. Normal compressibility, respiratory phasicity and response to augmentation. Saphenofemoral Junction: No evidence of thrombus. Normal compressibility and flow on color Doppler imaging. Profunda Femoral Vein: No evidence of thrombus. Normal compressibility and flow on color  Doppler imaging. Femoral Vein: No evidence of thrombus. Normal compressibility, respiratory phasicity and response to augmentation. Popliteal Vein: No evidence of thrombus. Normal compressibility, respiratory phasicity and response to augmentation. Calf Veins: No evidence of thrombus. Normal compressibility and flow on color Doppler imaging. Superficial Great Saphenous Vein: No evidence of thrombus. Normal compressibility. Venous Reflux:  None. Other Findings:  None. LEFT LOWER EXTREMITY Common Femoral Vein: No evidence of thrombus. Normal compressibility, respiratory phasicity and response to augmentation. Saphenofemoral Junction: No evidence of thrombus. Normal compressibility and flow on color Doppler imaging. Profunda Femoral Vein: No evidence of thrombus. Normal compressibility and flow on color Doppler imaging. Femoral Vein: No evidence of thrombus. Normal compressibility,  respiratory phasicity and response to augmentation. Popliteal Vein: No evidence of thrombus. Normal compressibility, respiratory phasicity and response to augmentation. Calf Veins: No evidence of thrombus. Normal compressibility and flow on color Doppler imaging. Superficial Great Saphenous Vein: No evidence of thrombus. Normal compressibility. Venous Reflux:  None. Other Findings:  None. IMPRESSION: No evidence of deep venous thrombosis. Electronically Signed   By: Charlett Nose M.D.   On: 05/07/2017 15:49   Scheduled Meds: . carvedilol  3.125 mg Oral BID WC  . docusate sodium  100 mg Oral BID  . enoxaparin (LOVENOX) injection  80 mg Subcutaneous Q24H  . furosemide  20 mg Oral BID  . insulin aspart  0-20 Units Subcutaneous TID WC  . insulin aspart  6 Units Subcutaneous TID WC  . lidocaine  2 patch Transdermal Q24H  . lisinopril  5 mg Oral Daily  . metolazone  10 mg Oral Q M,W,F-1800  . naproxen  500 mg Oral BID WC  . pantoprazole  40 mg Oral BID AC  . potassium chloride SA  20 mEq Oral BID  . pravastatin  20 mg Oral q1800  .  sodium chloride flush  3 mL Intravenous Q12H   Continuous Infusions: . sodium chloride Stopped (05/08/17 1546)  . meropenem (MERREM) IV Stopped (05/09/17 4098)  . vancomycin Stopped (05/08/17 2354)    Principal Problem:   Cellulitis, leg Active Problems:   Fever and chills   Sinus tachycardia   CAD (coronary artery disease)   Obesity   Dyslipidemia  Time spent:   Standley Dakins, MD, FAAFP [facility] Pager [phone]  If 7PM-7AM, please contact night-coverage [email or web address] Password [email or web address] 05/09/2017, 9:57 AM    LOS: 2 days

## 2017-05-09 NOTE — Care Management Note (Signed)
Case Management Note  Patient Details  Name: Barry RabonJohn Binford MRN: [id number] Date of Birth: 03/10/1966  Subjective/Objective:            Admitted with cellulitis. Pt is from home, lives with wife. He is a Education officer, environmentalpastor. He is ind with ADL's drives. [place] He has to travel there but says it is worth it, he gets all medical care he needs free of cost. They do not cover medications. He has a HSA account he uses for medications or urgent medical needs such as this. He has inusrance so [facility] is [facility] Pt says he will [facility] so that he may request f/u visit after DC.         Action/Plan: CM will have pt sign consent and fax DC summary after he leaves. Pt to provide CM with fax information. CM will cont to follow.   Expected Discharge Date:     05/13/2017             Expected Discharge Plan:  Home/Self Care  In-House Referral:  NA  Discharge planning Services  CM Consult  Post Acute Care Choice:  NA Choice offered to:  NA  Status of Service:  Will cont to follow.   Malcolm Metrohildress, Uday Jantz Demske, RN 05/09/2017, 1:18 PM

## 2017-05-10 DIAGNOSIS — R509 Fever, unspecified: Secondary | ICD-10-CM | POA: Diagnosis not present

## 2017-05-10 DIAGNOSIS — L03116 Cellulitis of left lower limb: Secondary | ICD-10-CM | POA: Diagnosis not present

## 2017-05-10 DIAGNOSIS — I2510 Atherosclerotic heart disease of native coronary artery without angina pectoris: Secondary | ICD-10-CM | POA: Diagnosis not present

## 2017-05-10 DIAGNOSIS — E785 Hyperlipidemia, unspecified: Secondary | ICD-10-CM | POA: Diagnosis not present

## 2017-05-10 LAB — CBC WITH DIFFERENTIAL/PLATELET
BASOS PCT: 0 %
Basophils Absolute: 0 10*3/uL (ref 0.0–0.1)
Eosinophils Absolute: 0.3 10*3/uL (ref 0.0–0.7)
Eosinophils Relative: 4 %
HCT: 37.5 % — ABNORMAL LOW (ref 39.0–52.0)
Hemoglobin: 12.8 g/dL — ABNORMAL LOW (ref 13.0–17.0)
LYMPHS PCT: 25 %
Lymphs Abs: 1.5 10*3/uL (ref 0.7–4.0)
MCH: 28.8 pg (ref 26.0–34.0)
MCHC: 34.1 g/dL (ref 30.0–36.0)
MCV: 84.3 fL (ref 78.0–100.0)
MONO ABS: 0.7 10*3/uL (ref 0.1–1.0)
Monocytes Relative: 12 %
NEUTROS ABS: 3.5 10*3/uL (ref 1.7–7.7)
NEUTROS PCT: 59 %
PLATELETS: 165 10*3/uL (ref 150–400)
RBC: 4.45 MIL/uL (ref 4.22–5.81)
RDW: 13.4 % (ref 11.5–15.5)
WBC: 6 10*3/uL (ref 4.0–10.5)

## 2017-05-10 LAB — MAGNESIUM: MAGNESIUM: 1.7 mg/dL (ref 1.7–2.4)

## 2017-05-10 LAB — GLUCOSE, CAPILLARY
GLUCOSE-CAPILLARY: 144 mg/dL — AB (ref 65–99)
GLUCOSE-CAPILLARY: 129 mg/dL — AB (ref 65–99)
Glucose-Capillary: 114 mg/dL — ABNORMAL HIGH (ref 65–99)
Glucose-Capillary: 181 mg/dL — ABNORMAL HIGH (ref 65–99)

## 2017-05-10 LAB — COMPREHENSIVE METABOLIC PANEL
ALBUMIN: 2.8 g/dL — AB (ref 3.5–5.0)
ALT: 39 U/L (ref 17–63)
AST: 46 U/L — AB (ref 15–41)
Alkaline Phosphatase: 98 U/L (ref 38–126)
Anion gap: 9 (ref 5–15)
BUN: 15 mg/dL (ref 6–20)
CHLORIDE: 98 mmol/L — AB (ref 101–111)
CO2: 28 mmol/L (ref 22–32)
Calcium: 8.8 mg/dL — ABNORMAL LOW (ref 8.9–10.3)
Creatinine, Ser: 0.93 mg/dL (ref 0.61–1.24)
GFR calc Af Amer: >60 mL/min (ref >60)
Glucose, Bld: 129 mg/dL — ABNORMAL HIGH (ref 65–99)
POTASSIUM: 3.7 mmol/L (ref 3.5–5.1)
Sodium: 135 mmol/L (ref 135–145)
Total Bilirubin: 0.9 mg/dL (ref 0.3–1.2)
Total Protein: 6.9 g/dL (ref 6.5–8.1)

## 2017-05-10 NOTE — Progress Notes (Signed)
PROGRESS NOTE   Barry Henry  ZOX:[id number]  DOB: 07-03-1966  DOA: 05/07/2017 PCP: Patient, No Pcp Per  Brief Admission Hx:  Barry Henry is a 51 y.o. male with type 2 diabetes mellitus who reports that 3 days ago he started having fever and chills. He was admitted for cellulitis of the LLE.    MDM/Assessment & Plan:   1. Acute cellulitis of the left lower extremity-swelling and pain persists with increased proximal spread, but less edema noted today.  Continue vanc/meropenem.  It is still painful for him to walk at times.  DVT prophylaxis ordered.  Ultrasound of the left lower extremity negative for acute DVT findings.  Blood culture x 2 obtained NGTD.  Elevate extremity.   2. Fever and chills secondary to above,  lactic acid level normal.  Tylenol ordered for fever, naproxen ordered.  3. SIRS - resolved now.  secondary to above, continue supportive therapy and follow.  4. Type 2 diabetes mellitus - hold home metformin, provide supplemental sliding scale coverage, monitor blood sugars, prandial novolog ordered as well.  Hg A1c 6.9%.  Carb modified diet ordered.   5. CAD s/p MI - currently stable, follow. Resume all home cardiac medications.  6. Dyslipidemia - resume home statin medication.   lipid panel suggests control of disease.  Very low HDL is noted.  Heart healthy diet ordered.  7. Acute low back pain - improved with ambulation and bed adjustment topical lidocaine, oral naproxen, oxycodone ordered for severe pain. Follow clinically.   8. Morbid obesity - pt working on weight loss.    DVT Prophylaxis: lovenox Code Status: Full   Family Communication: bedside  Disposition Plan: Home when medically ready.     Subjective: Pt still having a lot of pain when ambulating but less redness, still spreading proximally   Objective: Vitals:   05/09/17 0511 05/09/17 1443 05/09/17 2112 05/10/17 0530  BP: (!) 97/54 119/68 (!) 107/56 (!) 107/55  Pulse: 74 78 72 67  Resp: 18 18 20 18     Temp: 98.1 F (36.7 C) 97.7 F (36.5 C) 97.9 F (36.6 C) 97.7 F (36.5 C)  TempSrc: Oral Oral Oral Oral  SpO2: 99% 95% 96% 95%  Weight:      Height:        Intake/Output Summary (Last 24 hours) at 05/10/17 0859 Last data filed at 05/09/17 1600  Gross per 24 hour  Intake              690 ml  Output                0 ml  Net              690 ml   Filed Weights   05/07/17 1309  Weight: (!) 172.4 kg (380 lb)     REVIEW OF SYSTEMS  As per history otherwise all reviewed and reported negative  Exam:  General exam: morbidly obese male, NAD. Cooperative.  Respiratory system: Clear. No increased work of breathing. Cardiovascular system: S1 & S2 heard, RRR.  Gastrointestinal system: Abdomen is nondistended, soft and nontender. Normal bowel sounds heard. Central nervous system: Alert and oriented. No focal neurological deficits. Extremities: LLE with erythema, rubor, less edema noted, wrinkling of skin noted, still hot to touch, no fluctuance or purulence appreciated.       Data Reviewed:  CBG (last 3)   Recent Labs  05/09/17 1638 05/09/17 2042 05/10/17 0741  GLUCAP 122* 163* 144*    Filed Weights  05/07/17 1309  Weight: (!) 172.4 kg (380 lb)   Basic Metabolic Panel:  Recent Labs Lab 05/07/17 1336 05/08/17 0612 05/09/17 0440 05/10/17 0536  NA 133* 132* 136 135  K 3.5 3.2* 3.3* 3.7  CL 99* 99* 98* 98*  CO2 25 24 28 28   GLUCOSE 136* 130* 124* 129*  BUN 15 13 15 15   CREATININE 1.02 0.83 0.90 0.93  CALCIUM 8.2* 8.1* 8.2* 8.8*  MG  --  1.7 1.8 1.7   Liver Function Tests:  Recent Labs Lab 05/07/17 1336 05/08/17 0612 05/09/17 0440 05/10/17 0536  AST 42* 31 25 46*  ALT 40 33 28 39  ALKPHOS 60 55 70 98  BILITOT 1.2 1.1 1.1 0.9  PROT 7.1 6.2* 6.4* 6.9  ALBUMIN 3.2* 2.7* 2.7* 2.8*   No results for input(s): LIPASE, AMYLASE in the last 168 hours. No results for input(s): AMMONIA in the last 168 hours. CBC:  Recent Labs Lab 05/07/17 1336  05/08/17 0612 05/09/17 0440 05/10/17 0536  WBC 10.5 6.2 5.0 6.0  NEUTROABS 9.3* 4.8 3.1 3.5  HGB 13.2 11.7* 12.2* 12.8*  HCT 39.3 35.0* 36.5* 37.5*  MCV 85.6 85.0 84.3 84.3  PLT 151 140* 139* 165   Cardiac Enzymes: No results for input(s): CKTOTAL, CKMB, CKMBINDEX, TROPONINI in the last 168 hours. CBG (last 3)   Recent Labs  05/09/17 1638 05/09/17 2042 05/10/17 0741  GLUCAP 122* 163* 144*   Recent Results (from the past 240 hour(s))  Blood culture (routine x 2)     Status: None (Preliminary result)   Collection Time: 05/07/17  1:37 PM  Result Value Ref Range Status   Specimen Description BLOOD LEFT FOREARM  Final   Special Requests   Final    BOTTLES DRAWN AEROBIC AND ANAEROBIC Blood Culture results may not be optimal due to an inadequate volume of blood received in culture bottles   Culture NO GROWTH 3 DAYS  Final   Report Status PENDING  Incomplete  Blood culture (routine x 2)     Status: None (Preliminary result)   Collection Time: 05/07/17  1:41 PM  Result Value Ref Range Status   Specimen Description BLOOD RIGHT ARM  Final   Special Requests   Final    BOTTLES DRAWN AEROBIC AND ANAEROBIC Blood Culture adequate volume   Culture NO GROWTH 3 DAYS  Final   Report Status PENDING  Incomplete     Studies: No results found. Scheduled Meds: . carvedilol  3.125 mg Oral BID WC  . docusate sodium  100 mg Oral BID  . enoxaparin (LOVENOX) injection  80 mg Subcutaneous Q24H  . furosemide  20 mg Oral BID  . insulin aspart  0-20 Units Subcutaneous TID WC  . insulin aspart  6 Units Subcutaneous TID WC  . lidocaine  2 patch Transdermal Q24H  . lisinopril  5 mg Oral Daily  . metolazone  10 mg Oral Q M,W,F-1800  . pantoprazole  40 mg Oral BID AC  . potassium chloride SA  20 mEq Oral BID  . pravastatin  20 mg Oral q1800  . sodium chloride flush  3 mL Intravenous Q12H   Continuous Infusions: . sodium chloride Stopped (05/08/17 1546)  . meropenem (MERREM) IV Stopped (05/10/17  0645)  . vancomycin Stopped (05/10/17 0125)    Principal Problem:   Cellulitis, leg Active Problems:   Fever and chills   Sinus tachycardia   CAD (coronary artery disease)   Obesity   Dyslipidemia  Time spent:  Standley Dakins, MD, FAAFP [facility] Pager [phone]  If 7PM-7AM, please contact night-coverage [email or web address] Password [email or web address] 05/10/2017, 8:59 AM    LOS: 3 days

## 2017-05-11 DIAGNOSIS — L03116 Cellulitis of left lower limb: Secondary | ICD-10-CM | POA: Diagnosis not present

## 2017-05-11 DIAGNOSIS — E785 Hyperlipidemia, unspecified: Secondary | ICD-10-CM | POA: Diagnosis not present

## 2017-05-11 DIAGNOSIS — I2510 Atherosclerotic heart disease of native coronary artery without angina pectoris: Secondary | ICD-10-CM | POA: Diagnosis not present

## 2017-05-11 DIAGNOSIS — R509 Fever, unspecified: Secondary | ICD-10-CM | POA: Diagnosis not present

## 2017-05-11 LAB — GLUCOSE, CAPILLARY
GLUCOSE-CAPILLARY: 134 mg/dL — AB (ref 65–99)
GLUCOSE-CAPILLARY: 163 mg/dL — AB (ref 65–99)
Glucose-Capillary: 144 mg/dL — ABNORMAL HIGH (ref 65–99)
Glucose-Capillary: 146 mg/dL — ABNORMAL HIGH (ref 65–99)

## 2017-05-11 LAB — VANCOMYCIN, TROUGH: VANCOMYCIN TR: 13 ug/mL — AB (ref 15–20)

## 2017-05-11 MED ORDER — HYOSCYAMINE SULFATE 0.125 MG/ML PO SOLN
0.2500 mg | Freq: Once | ORAL | Status: DC | PRN
  Filled 2017-05-11: qty 2

## 2017-05-11 NOTE — Progress Notes (Signed)
PROGRESS NOTE   Barry Henry  ZOX:[id number]  DOB: 03/23/1966  DOA: 05/07/2017 PCP: Patient, No Pcp Per  Brief Admission Hx:  Barry Henry is a 51 y.o. male with type 2 diabetes mellitus who reports that 3 days ago he started having fever and chills. He was admitted for cellulitis of the LLE.    MDM/Assessment & Plan:   1. Acute cellulitis of the left lower extremity-swelling and pain persists with increased proximal spread, but less edema noted today.  Continue vanc/meropenem.  It is still painful for him to walk at times.  Will have him ambulate more today.  DVT prophylaxis ordered.  Ultrasound of the left lower extremity negative for acute DVT findings.  Blood culture x 2 obtained NGTD.  Elevate extremity.   2. Fever and chills secondary to above,  lactic acid level normal.  Tylenol ordered for fever, naproxen ordered.  3. SIRS - resolved now.  secondary to above, continue supportive therapy and follow.  4. Type 2 diabetes mellitus - hold home metformin, provide supplemental sliding scale coverage, monitor blood sugars, prandial novolog ordered as well.  Hg A1c 6.9%.  Carb modified diet ordered.   5. CAD s/p MI - currently stable, follow. Resume all home cardiac medications.  6. Dyslipidemia - resume home statin medication.   lipid panel suggests control of disease.  Very low HDL is noted.  Heart healthy diet ordered.  7. Acute low back pain - much improved with ambulation and bed adjustments.  Follow clinically.   8. Morbid obesity - pt working on weight loss.    DVT Prophylaxis: lovenox Code Status: Full   Family Communication: bedside  Disposition Plan: not medically ready, still needs IV antibiotics, probably could discharge in next few days     Subjective: Pt is noticing less pain and able to ambulate a little more without severe pain.   Objective: Vitals:   05/10/17 0530 05/10/17 1400 05/10/17 2139 05/11/17 0629  BP: (!) 107/55 119/67 108/62 107/60  Pulse: 67 75 75  76  Resp: 18 18 18 19   Temp: 97.7 F (36.5 C) 97.8 F (36.6 C) 98.2 F (36.8 C) 97.7 F (36.5 C)  TempSrc: Oral Oral Oral Oral  SpO2: 95% 94% 98% 96%  Weight:      Height:        Intake/Output Summary (Last 24 hours) at 05/11/17 8119 Last data filed at 05/11/17 0300  Gross per 24 hour  Intake             1873 ml  Output                0 ml  Net             1873 ml   Filed Weights   05/07/17 1309  Weight: (!) 172.4 kg (380 lb)    REVIEW OF SYSTEMS  As per history otherwise all reviewed and reported negative  Exam:  General exam: morbidly obese male, NAD. Cooperative.  Respiratory system: Clear. No increased work of breathing. Cardiovascular system: S1 & S2 heard, RRR.  Gastrointestinal system: Abdomen is nondistended, soft and nontender. Normal bowel sounds heard. Central nervous system: Alert and oriented. No focal neurological deficits. Extremities: LLE with erythema, rubor, less edema noted, wrinkling of skin noted, still hot to touch, no fluctuance or purulence appreciated. No more proximal spread noted.       Data Reviewed:  CBG (last 3)   Recent Labs  05/10/17 1617 05/10/17 2137 05/11/17 0731  GLUCAP  114* 181* 134*    Filed Weights   05/07/17 1309  Weight: (!) 172.4 kg (380 lb)   Basic Metabolic Panel:  Recent Labs Lab 05/07/17 1336 05/08/17 0612 05/09/17 0440 05/10/17 0536  NA 133* 132* 136 135  K 3.5 3.2* 3.3* 3.7  CL 99* 99* 98* 98*  CO2 25 24 28 28   GLUCOSE 136* 130* 124* 129*  BUN 15 13 15 15   CREATININE 1.02 0.83 0.90 0.93  CALCIUM 8.2* 8.1* 8.2* 8.8*  MG  --  1.7 1.8 1.7   Liver Function Tests:  Recent Labs Lab 05/07/17 1336 05/08/17 0612 05/09/17 0440 05/10/17 0536  AST 42* 31 25 46*  ALT 40 33 28 39  ALKPHOS 60 55 70 98  BILITOT 1.2 1.1 1.1 0.9  PROT 7.1 6.2* 6.4* 6.9  ALBUMIN 3.2* 2.7* 2.7* 2.8*   No results for input(s): LIPASE, AMYLASE in the last 168 hours. No results for input(s): AMMONIA in the last 168  hours. CBC:  Recent Labs Lab 05/07/17 1336 05/08/17 0612 05/09/17 0440 05/10/17 0536  WBC 10.5 6.2 5.0 6.0  NEUTROABS 9.3* 4.8 3.1 3.5  HGB 13.2 11.7* 12.2* 12.8*  HCT 39.3 35.0* 36.5* 37.5*  MCV 85.6 85.0 84.3 84.3  PLT 151 140* 139* 165   Cardiac Enzymes: No results for input(s): CKTOTAL, CKMB, CKMBINDEX, TROPONINI in the last 168 hours. CBG (last 3)   Recent Labs  05/10/17 1617 05/10/17 2137 05/11/17 0731  GLUCAP 114* 181* 134*   Recent Results (from the past 240 hour(s))  Blood culture (routine x 2)     Status: None (Preliminary result)   Collection Time: 05/07/17  1:37 PM  Result Value Ref Range Status   Specimen Description BLOOD LEFT FOREARM  Final   Special Requests   Final    BOTTLES DRAWN AEROBIC AND ANAEROBIC Blood Culture results may not be optimal due to an inadequate volume of blood received in culture bottles   Culture NO GROWTH 4 DAYS  Final   Report Status PENDING  Incomplete  Blood culture (routine x 2)     Status: None (Preliminary result)   Collection Time: 05/07/17  1:41 PM  Result Value Ref Range Status   Specimen Description BLOOD RIGHT ARM  Final   Special Requests   Final    BOTTLES DRAWN AEROBIC AND ANAEROBIC Blood Culture adequate volume   Culture NO GROWTH 4 DAYS  Final   Report Status PENDING  Incomplete     Studies: No results found. Scheduled Meds: . carvedilol  3.125 mg Oral BID WC  . docusate sodium  100 mg Oral BID  . enoxaparin (LOVENOX) injection  80 mg Subcutaneous Q24H  . furosemide  20 mg Oral BID  . insulin aspart  0-20 Units Subcutaneous TID WC  . insulin aspart  6 Units Subcutaneous TID WC  . lidocaine  2 patch Transdermal Q24H  . lisinopril  5 mg Oral Daily  . metolazone  10 mg Oral Q M,W,F-1800  . pantoprazole  40 mg Oral BID AC  . potassium chloride SA  20 mEq Oral BID  . pravastatin  20 mg Oral q1800  . sodium chloride flush  3 mL Intravenous Q12H   Continuous Infusions: . sodium chloride Stopped (05/08/17  1546)  . meropenem (MERREM) IV Stopped (05/11/17 0758)  . vancomycin Stopped (05/10/17 2340)    Principal Problem:   Cellulitis, leg Active Problems:   Fever and chills   Sinus tachycardia   CAD (coronary artery disease)  Obesity   Dyslipidemia  Time spent:   Standley Dakinslanford Kimmi Acocella, MD, FAAFP [facility] Pager [phone]  If 7PM-7AM, please contact night-coverage [email or web address] Password [email or web address] 05/11/2017, 9:21 AM    LOS: 4 days

## 2017-05-11 NOTE — Progress Notes (Addendum)
Pharmacy Antibiotic Note  Barry RabonJohn Henry is a 51 y.o. male admitted on 05/07/2017 with cellulitis./ fever.   Pharmacy has been consulted for Vancomycin and Meropenem dosing. Vanc trough = 8013mcg/ml, at goal. Some improvement with edema noted, bu still painful for pt to walk.   Plan: Continue Vancomycin 1250mg  IV q12hrs (VT goal 10-2815mcg/ml) Continue Meropenem 1gm IV q8h Monitor labs, progress, c/s  Height: 5\' 8"  (172.7 cm) Weight: (!) 380 lb (172.4 kg) IBW/kg (Calculated) : 68.4  Temp (24hrs), Avg:97.9 F (36.6 C), Min:97.7 F (36.5 C), Max:98.2 F (36.8 C)   Recent Labs Lab 05/07/17 1336 05/07/17 1529 05/08/17 0612 05/09/17 0440 05/10/17 0536 05/11/17 0905  WBC 10.5  --  6.2 5.0 6.0  --   CREATININE 1.02  --  0.83 0.90 0.93  --   LATICACIDVEN  --  1.6  --   --   --   --   VANCOTROUGH  --   --   --   --   --  13*    Estimated Creatinine Clearance: 142.9 mL/min (by C-G formula based on SCr of 0.93 mg/dL).    Allergies  Allergen Reactions  . Robaxin [Methocarbamol] Other (See Comments)    Pt reports "it makes my heart stop beating"   Antimicrobials this admission: Vancomycin 10/25 >>  Meropenem 10/25 >>  Cefazolin 10/24>10/25 Dose adjustments this admission:   Microbiology results:  10/24 BCx: ngtd  Thank you for allowing pharmacy to be a part of this patient's care.  Elder CyphersLorie Moksh Loomer, BS Pharm D, [place] Clinical Pharmacist Pager [phone] 05/11/2017 10:44 AM

## 2017-05-12 DIAGNOSIS — R509 Fever, unspecified: Secondary | ICD-10-CM | POA: Diagnosis not present

## 2017-05-12 DIAGNOSIS — E785 Hyperlipidemia, unspecified: Secondary | ICD-10-CM | POA: Diagnosis not present

## 2017-05-12 DIAGNOSIS — R000 Tachycardia, unspecified: Secondary | ICD-10-CM | POA: Diagnosis not present

## 2017-05-12 DIAGNOSIS — L03116 Cellulitis of left lower limb: Secondary | ICD-10-CM | POA: Diagnosis not present

## 2017-05-12 LAB — BASIC METABOLIC PANEL
ANION GAP: 9 (ref 5–15)
BUN: 18 mg/dL (ref 6–20)
CO2: 26 mmol/L (ref 22–32)
Calcium: 8.8 mg/dL — ABNORMAL LOW (ref 8.9–10.3)
Chloride: 97 mmol/L — ABNORMAL LOW (ref 101–111)
Creatinine, Ser: 0.81 mg/dL (ref 0.61–1.24)
GLUCOSE: 128 mg/dL — AB (ref 65–99)
POTASSIUM: 3.6 mmol/L (ref 3.5–5.1)
SODIUM: 132 mmol/L — AB (ref 135–145)

## 2017-05-12 LAB — GLUCOSE, CAPILLARY
GLUCOSE-CAPILLARY: 120 mg/dL — AB (ref 65–99)
GLUCOSE-CAPILLARY: 164 mg/dL — AB (ref 65–99)
GLUCOSE-CAPILLARY: 138 mg/dL — AB (ref 65–99)
Glucose-Capillary: 149 mg/dL — ABNORMAL HIGH (ref 65–99)

## 2017-05-12 LAB — CULTURE, BLOOD (ROUTINE X 2)
CULTURE: NO GROWTH
Culture: NO GROWTH
SPECIAL REQUESTS: ADEQUATE

## 2017-05-12 MED ORDER — CEFAZOLIN SODIUM-DEXTROSE 2-4 GM/100ML-% IV SOLN
2.0000 g | Freq: Three times a day (TID) | INTRAVENOUS | Status: DC
  Administered 2017-05-12 – 2017-05-13 (×2): 2 g via INTRAVENOUS
  Filled 2017-05-12 (×4): qty 100

## 2017-05-12 MED ORDER — FUROSEMIDE 20 MG PO TABS: 20.0000 mg | ORAL_TABLET | Freq: Every day | ORAL | Status: DC

## 2017-05-12 MED ORDER — POTASSIUM CHLORIDE CRYS ER 20 MEQ PO TBCR: 20.0000 meq | EXTENDED_RELEASE_TABLET | Freq: Every day | ORAL | Status: DC

## 2017-05-12 NOTE — Progress Notes (Signed)
PROGRESS NOTE   Barry Henry  ZOX:[id number]  DOB: 02-Jul-1966  DOA: 05/07/2017 PCP: Patient, No Pcp Per  Brief Admission Hx:  Barry Henry is a 51 y.o. male with type 2 diabetes mellitus who reports that 3 days ago he started having fever and chills. He was admitted for cellulitis of the LLE.    MDM/Assessment & Plan:   1. Acute cellulitis of the left lower extremity-much improved today, ambulating better, with less pain, will de-escalate antibiotics to IV cefazolin today and hopefully can switch to oral antibiotics and discharge home tomorrow.    DVT prophylaxis ordered.  Ultrasound of the left lower extremity negative for acute DVT findings.  Blood culture x 2 obtained NGTD.  Elevate extremity.   2. Fever and chills- resolved now.  Secondary to above,  lactic acid level normal.  Tylenol ordered for fever as needed.   3. SIRS - resolved now.  secondary to above, continue supportive therapy and follow.  4. Type 2 diabetes mellitus - hold home metformin, provide supplemental sliding scale coverage, monitor blood sugars, prandial novolog ordered as well.  Hg A1c 6.9%.  Carb modified diet ordered.   5. CAD s/p MI - currently stable, follow. Resume all home cardiac medications.  6. Dyslipidemia - resume home statin medication.   lipid panel suggests control of disease.  Very low HDL is noted.  Heart healthy diet ordered.  7. Acute low back pain - much improved with ambulation and bed adjustments.  Follow clinically.   8. Morbid obesity - pt working on weight loss and exercise.    DVT Prophylaxis: lovenox Code Status: Full   Family Communication: wife at bedside  Disposition Plan: Home tomorrow    Subjective: Pt reports marked improvement   Objective: Vitals:   05/11/17 0939 05/11/17 1400 05/11/17 2154 05/12/17 0450  BP: (!) 103/59 104/61 113/71 111/68  Pulse: 75 77 73 72  Resp: 18 18 19 18   Temp:  97.8 F (36.6 C) 98.4 F (36.9 C) 98.3 F (36.8 C)  TempSrc:  Oral Oral Oral    SpO2: 98% 94% 98% 97%  Weight:      Height:        Intake/Output Summary (Last 24 hours) at 05/12/17 1105 Last data filed at 05/12/17 0900  Gross per 24 hour  Intake             1310 ml  Output                0 ml  Net             1310 ml   Filed Weights   05/07/17 1309  Weight: (!) 172.4 kg (380 lb)    REVIEW OF SYSTEMS  As per history otherwise all reviewed and reported negative  Exam:  General exam: morbidly obese male, NAD. Cooperative.  Respiratory system: Clear. No increased work of breathing. Cardiovascular system: S1 & S2 heard, RRR.  Gastrointestinal system: Abdomen is nondistended, soft and nontender. Normal bowel sounds heard. Central nervous system: Alert and oriented. No focal neurological deficits. Extremities: LLE much improved with much less edema noted, wrinkling of skin noted, still hot to touch, no fluctuance or purulence appreciated. No more proximal spread noted.       Data Reviewed:  CBG (last 3)   Recent Labs  05/11/17 1638 05/11/17 2204 05/12/17 0725  GLUCAP 144* 146* 120*    Filed Weights   05/07/17 1309  Weight: (!) 172.4 kg (380 lb)   Basic Metabolic  Panel:  Recent Labs Lab 05/07/17 1336 05/08/17 0612 05/09/17 0440 05/10/17 0536 05/12/17 0504  NA 133* 132* 136 135 132*  K 3.5 3.2* 3.3* 3.7 3.6  CL 99* 99* 98* 98* 97*  CO2 25 24 28 28 26   GLUCOSE 136* 130* 124* 129* 128*  BUN 15 13 15 15 18   CREATININE 1.02 0.83 0.90 0.93 0.81  CALCIUM 8.2* 8.1* 8.2* 8.8* 8.8*  MG  --  1.7 1.8 1.7  --    Liver Function Tests:  Recent Labs Lab 05/07/17 1336 05/08/17 0612 05/09/17 0440 05/10/17 0536  AST 42* 31 25 46*  ALT 40 33 28 39  ALKPHOS 60 55 70 98  BILITOT 1.2 1.1 1.1 0.9  PROT 7.1 6.2* 6.4* 6.9  ALBUMIN 3.2* 2.7* 2.7* 2.8*   No results for input(s): LIPASE, AMYLASE in the last 168 hours. No results for input(s): AMMONIA in the last 168 hours. CBC:  Recent Labs Lab 05/07/17 1336 05/08/17 0612 05/09/17 0440  05/10/17 0536  WBC 10.5 6.2 5.0 6.0  NEUTROABS 9.3* 4.8 3.1 3.5  HGB 13.2 11.7* 12.2* 12.8*  HCT 39.3 35.0* 36.5* 37.5*  MCV 85.6 85.0 84.3 84.3  PLT 151 140* 139* 165   Cardiac Enzymes: No results for input(s): CKTOTAL, CKMB, CKMBINDEX, TROPONINI in the last 168 hours. CBG (last 3)   Recent Labs  05/11/17 1638 05/11/17 2204 05/12/17 0725  GLUCAP 144* 146* 120*   Recent Results (from the past 240 hour(s))  Blood culture (routine x 2)     Status: None   Collection Time: 05/07/17  1:37 PM  Result Value Ref Range Status   Specimen Description BLOOD LEFT FOREARM  Final   Special Requests   Final    BOTTLES DRAWN AEROBIC AND ANAEROBIC Blood Culture results may not be optimal due to an inadequate volume of blood received in culture bottles   Culture NO GROWTH 5 DAYS  Final   Report Status 05/12/2017 FINAL  Final  Blood culture (routine x 2)     Status: None   Collection Time: 05/07/17  1:41 PM  Result Value Ref Range Status   Specimen Description BLOOD RIGHT ARM  Final   Special Requests   Final    BOTTLES DRAWN AEROBIC AND ANAEROBIC Blood Culture adequate volume   Culture NO GROWTH 5 DAYS  Final   Report Status 05/12/2017 FINAL  Final     Studies: No results found. Scheduled Meds: . carvedilol  3.125 mg Oral BID WC  . docusate sodium  100 mg Oral BID  . enoxaparin (LOVENOX) injection  80 mg Subcutaneous Q24H  . [START ON 05/14/2017] furosemide  20 mg Oral Daily  . insulin aspart  0-20 Units Subcutaneous TID WC  . insulin aspart  6 Units Subcutaneous TID WC  . lisinopril  5 mg Oral Daily  . metolazone  10 mg Oral Q M,W,F-1800  . pantoprazole  40 mg Oral BID AC  . [START ON 05/14/2017] potassium chloride SA  20 mEq Oral Daily  . pravastatin  20 mg Oral q1800  . sodium chloride flush  3 mL Intravenous Q12H   Continuous Infusions: . sodium chloride Stopped (05/08/17 1546)  .  ceFAZolin (ANCEF) IV Stopped (05/12/17 1016)    Principal Problem:   Cellulitis,  leg Active Problems:   Fever and chills   Sinus tachycardia   CAD (coronary artery disease)   Obesity   Dyslipidemia  Time spent:   Standley Dakins, MD, FAAFP [facility] Pager [phone]  If 7PM-7AM, please contact night-coverage [email or web address] Password [email or web address] 05/12/2017, 11:05 AM    LOS: 5 days

## 2017-05-13 DIAGNOSIS — L03116 Cellulitis of left lower limb: Secondary | ICD-10-CM | POA: Diagnosis not present

## 2017-05-13 DIAGNOSIS — R000 Tachycardia, unspecified: Secondary | ICD-10-CM | POA: Diagnosis not present

## 2017-05-13 DIAGNOSIS — R509 Fever, unspecified: Secondary | ICD-10-CM | POA: Diagnosis not present

## 2017-05-13 DIAGNOSIS — E785 Hyperlipidemia, unspecified: Secondary | ICD-10-CM | POA: Diagnosis not present

## 2017-05-13 LAB — GLUCOSE, CAPILLARY: Glucose-Capillary: 116 mg/dL — ABNORMAL HIGH (ref 65–99)

## 2017-05-13 MED ORDER — SULFAMETHOXAZOLE-TRIMETHOPRIM 800-160 MG PO TABS
1.0000 | ORAL_TABLET | Freq: Two times a day (BID) | ORAL | Status: DC
  Administered 2017-05-13: 1 via ORAL
  Filled 2017-05-13: qty 1

## 2017-05-13 MED ORDER — SULFAMETHOXAZOLE-TRIMETHOPRIM 800-160 MG PO TABS: 1.0000 | ORAL_TABLET | Freq: Two times a day (BID) | ORAL | 0 refills | Status: AC

## 2017-05-13 NOTE — Plan of Care (Signed)
Problem: Nutrition: Goal: Adequate nutrition will be maintained Outcome: Progressing Assess pt's current nutritional status and monitor intake/output

## 2017-05-13 NOTE — Discharge Summary (Signed)
Physician Discharge Summary  Barry Henry ZOX:[id number] DOB: 04/22/1966 DOA: 05/07/2017  PCP: [facility] [place] [facility]  Admit date: 05/07/2017 Discharge date: 05/13/2017  Admitted From: Home  Disposition: Home  Recommendations for Outpatient Follow-up:  1. Follow up with PCP in 1 weeks 2. Please follow up on the following pending results: final culture results  Discharge Condition: STABLE  CODE STATUS: FULL   Brief Hospitalization Summary: Please see all hospital notes, images, labs for full details of the hospitalization.  HPI: Barry Henry is a 52 y.o. male with type 2 diabetes mellitus who reports that 3 days PTA he started having fever and chills.  He reports that he went to the emergency department at another facility and had a workup including a CTA chest that was negative for PE.  He reports that he was discharged home.  The patient reports that he continued to have fever and chills.  The patient reports that he noticed increasing swelling and erythema of his left lower extremity.  He was brought to the emergency department at [facility] and noted to have a bright red swollen left lower extremity.  He was noted to have fever.  The patient was started on IV antibiotic therapy and hospital admission was requested for treatment of left lower extremity cellulitis.  The patient reports that he has been laying on the bed at home for the past 3 days because of malaise.  He reports symptoms of low back pain related to lying flat in bed for the past several days.  He denies loss of bowel or bladder function or control.  Brief Admission Hx: Barry Henry a 51 y.o.malewith type 2 diabetes mellitus who reports that 3 days ago he started having fever and chills. He was admitted for cellulitis of the LLE.    MDM/Assessment & Plan:   1. Acute cellulitis of the left lower extremity-much improved,  ambulating better, with much less pain. Discharge home on oral  Bactrim DS 1 po BID x 10 days.   DVT prophylaxis given in hospital.  Ultrasound of the left lower extremity negative for acute DVT findings. Blood culture x 2 obtained NGTD.  Elevate extremity encouraged.  2. Fever and chills- resolved now.  Secondary to above,  lactic acid level normal.  Tylenol ordered for fever as needed.   3. SIRS - resolved now.  secondary to above, continue supportive therapy and follow.  4. Type 2 diabetes mellitus - Resume home metformin at discharge, In hospital provided supplemental sliding scale coverage, monitor blood sugars, prandial novolog ordered as well. Hg A1c 6.9%.  Carb modified diet ordered.  5. CAD s/p MI - currently stable, follow. Resume all home cardiac medications.   6. Dyslipidemia - resume home statin medication.  lipid panel suggests control of disease.  Very low HDL is noted.  Heart healthy diet ordered.  7. Acute low back pain - much improved with ambulation and bed adjustments.  Follow clinically.   DVT Prophylaxis:lovenox Code Status:Full  Family Communication:wife at bedside Disposition Plan:Home    Discharge Diagnoses:  Principal Problem:   Cellulitis, leg Active Problems:   Fever and chills   Sinus tachycardia   CAD (coronary artery disease)   Obesity   Dyslipidemia  Discharge Instructions: Discharge Instructions    Call MD for:  difficulty breathing, headache or visual disturbances    Complete by:  As directed    Call MD for:  extreme fatigue    Complete by:  As directed    Call  MD for:  persistant dizziness or light-headedness    Complete by:  As directed    Call MD for:  persistant nausea and vomiting    Complete by:  As directed    Call MD for:  severe uncontrolled pain    Complete by:  As directed    Increase activity slowly    Complete by:  As directed      Allergies as of 05/13/2017      Reactions   Robaxin [methocarbamol] Other (See Comments)   Pt reports "it makes my heart stop beating"        Medication List    TAKE these medications   carvedilol 3.125 MG tablet Commonly known as:  COREG Take 3.125 mg by mouth 2 (two) times daily with a meal.   furosemide 20 MG tablet Commonly known as:  LASIX Take 20 mg by mouth 2 (two) times daily.   lisinopril 5 MG tablet Commonly known as:  PRINIVIL,ZESTRIL Take 5 mg by mouth daily.   lovastatin 10 MG tablet Commonly known as:  MEVACOR Take 10 mg by mouth at bedtime.   metFORMIN 500 MG tablet Commonly known as:  GLUCOPHAGE Take 500 mg by mouth 2 (two) times daily with a meal.   metolazone 10 MG tablet Commonly known as:  ZAROXOLYN Take 10 mg by mouth daily. take on mon, wed,fridays   potassium chloride SA 20 MEQ tablet Commonly known as:  K-DUR,KLOR-CON Take 20 mEq by mouth 2 (two) times daily.   sulfamethoxazole-trimethoprim 800-160 MG tablet Commonly known as:  BACTRIM DS,SEPTRA DS Take 1 tablet by mouth 2 (two) times daily.      Follow-up Information    [facility]. Schedule an appointment as soon as possible for a visit in 1 week(s).   Why:  Hospital Follow Up          Allergies  Allergen Reactions  . Robaxin [Methocarbamol] Other (See Comments)    Pt reports "it makes my heart stop beating"   Current Discharge Medication List    START taking these medications   Details  sulfamethoxazole-trimethoprim (BACTRIM DS,SEPTRA DS) 800-160 MG tablet Take 1 tablet by mouth 2 (two) times daily. Qty: 20 tablet, Refills: 0      CONTINUE these medications which have NOT CHANGED   Details  carvedilol (COREG) 3.125 MG tablet Take 3.125 mg by mouth 2 (two) times daily with a meal.    furosemide (LASIX) 20 MG tablet Take 20 mg by mouth 2 (two) times daily.    lisinopril (PRINIVIL,ZESTRIL) 5 MG tablet Take 5 mg by mouth daily.    lovastatin (MEVACOR) 10 MG tablet Take 10 mg by mouth at bedtime.    metFORMIN (GLUCOPHAGE) 500 MG tablet Take 500 mg by mouth 2 (two) times daily with a meal.     metolazone (ZAROXOLYN) 10 MG tablet Take 10 mg by mouth daily. take on mon, wed,fridays    potassium chloride SA (K-DUR,KLOR-CON) 20 MEQ tablet Take 20 mEq by mouth 2 (two) times daily.        Procedures/Studies: US Venous Img Lower Bilateral  Result Date: 05/07/2017 CLINICAL DATA:  Left lower extremity redness and edema EXAM: BILATERAL LOWER EXTREMITY VENOUS DOPPLER ULTRASOUND TECHNIQUE: Gray-scale sonography with graded compression, as well as color Doppler and duplex ultrasound were performed to evaluate the lower extremity deep venous systems from the level of the common femoral vein and including the common femoral, femoral, profunda femoral, popliteal and calf veins including the posterior  tibial, peroneal and gastrocnemius veins when visible. The superficial great saphenous vein was also interrogated. Spectral Doppler was utilized to evaluate flow at rest and with distal augmentation maneuvers in the common femoral, femoral and popliteal veins. COMPARISON:  None. FINDINGS: RIGHT LOWER EXTREMITY Common Femoral Vein: No evidence of thrombus. Normal compressibility, respiratory phasicity and response to augmentation. Saphenofemoral Junction: No evidence of thrombus. Normal compressibility and flow on color Doppler imaging. Profunda Femoral Vein: No evidence of thrombus. Normal compressibility and flow on color Doppler imaging. Femoral Vein: No evidence of thrombus. Normal compressibility, respiratory phasicity and response to augmentation. Popliteal Vein: No evidence of thrombus. Normal compressibility, respiratory phasicity and response to augmentation. Calf Veins: No evidence of thrombus. Normal compressibility and flow on color Doppler imaging. Superficial Great Saphenous Vein: No evidence of thrombus. Normal compressibility. Venous Reflux:  None. Other Findings:  None. LEFT LOWER EXTREMITY Common Femoral Vein: No evidence of thrombus. Normal compressibility, respiratory phasicity and response to  augmentation. Saphenofemoral Junction: No evidence of thrombus. Normal compressibility and flow on color Doppler imaging. Profunda Femoral Vein: No evidence of thrombus. Normal compressibility and flow on color Doppler imaging. Femoral Vein: No evidence of thrombus. Normal compressibility, respiratory phasicity and response to augmentation. Popliteal Vein: No evidence of thrombus. Normal compressibility, respiratory phasicity and response to augmentation. Calf Veins: No evidence of thrombus. Normal compressibility and flow on color Doppler imaging. Superficial Great Saphenous Vein: No evidence of thrombus. Normal compressibility. Venous Reflux:  None. Other Findings:  None. IMPRESSION: No evidence of deep venous thrombosis. Electronically Signed   By: Charlett Nose M.D.   On: 05/07/2017 15:49      Subjective: Pt says he feels much better today, he is ambulating without pain today.  He says he will follow up with his primary care provider.   Discharge Exam: Vitals:   05/12/17 2045 05/13/17 0552  BP: 109/68 111/64  Pulse: 73 73  Resp: 20 20  Temp: 98.1 F (36.7 C) 98.3 F (36.8 C)  SpO2: 99% 96%   Vitals:   05/12/17 1137 05/12/17 1429 05/12/17 2045 05/13/17 0552  BP:  117/68 109/68 111/64  Pulse:  74 73 73  Resp:  18 20 20   Temp:  98.1 F (36.7 C) 98.1 F (36.7 C) 98.3 F (36.8 C)  TempSrc:  Oral Oral Oral  SpO2: 97% 99% 99% 96%  Weight:      Height:       General exam: morbidly obese male, NAD. Cooperative.  Respiratory system: Clear. No increased work of breathing. Cardiovascular system: S1 & S2 heard, RRR.  Gastrointestinal system: Abdomen is nondistended, soft and nontender. Normal bowel sounds heard. Central nervous system: Alert and oriented. No focal neurological deficits. Extremities: LLE much improved with much less edema noted, wrinkling of skin noted,  no fluctuance or purulence appreciated. No more proximal spread noted.      The results of significant diagnostics  from this hospitalization (including imaging, microbiology, ancillary and laboratory) are listed below for reference.     Microbiology: Recent Results (from the past 240 hour(s))  Blood culture (routine x 2)     Status: None   Collection Time: 05/07/17  1:37 PM  Result Value Ref Range Status   Specimen Description BLOOD LEFT FOREARM  Final   Special Requests   Final    BOTTLES DRAWN AEROBIC AND ANAEROBIC Blood Culture results may not be optimal due to an inadequate volume of blood received in culture bottles   Culture NO GROWTH 5 DAYS  Final   Report Status 05/12/2017 FINAL  Final  Blood culture (routine x 2)     Status: None   Collection Time: 05/07/17  1:41 PM  Result Value Ref Range Status   Specimen Description BLOOD RIGHT ARM  Final   Special Requests   Final    BOTTLES DRAWN AEROBIC AND ANAEROBIC Blood Culture adequate volume   Culture NO GROWTH 5 DAYS  Final   Report Status 05/12/2017 FINAL  Final     Labs: BNP (last 3 results) No results for input(s): BNP in the last 8760 hours. Basic Metabolic Panel:  Recent Labs Lab 05/07/17 1336 05/08/17 0612 05/09/17 0440 05/10/17 0536 05/12/17 0504  NA 133* 132* 136 135 132*  K 3.5 3.2* 3.3* 3.7 3.6  CL 99* 99* 98* 98* 97*  CO2 25 24 28 28 26   GLUCOSE 136* 130* 124* 129* 128*  BUN 15 13 15 15 18   CREATININE 1.02 0.83 0.90 0.93 0.81  CALCIUM 8.2* 8.1* 8.2* 8.8* 8.8*  MG  --  1.7 1.8 1.7  --    Liver Function Tests:  Recent Labs Lab 05/07/17 1336 05/08/17 0612 05/09/17 0440 05/10/17 0536  AST 42* 31 25 46*  ALT 40 33 28 39  ALKPHOS 60 55 70 98  BILITOT 1.2 1.1 1.1 0.9  PROT 7.1 6.2* 6.4* 6.9  ALBUMIN 3.2* 2.7* 2.7* 2.8*   No results for input(s): LIPASE, AMYLASE in the last 168 hours. No results for input(s): AMMONIA in the last 168 hours. CBC:  Recent Labs Lab 05/07/17 1336 05/08/17 0612 05/09/17 0440 05/10/17 0536  WBC 10.5 6.2 5.0 6.0  NEUTROABS 9.3* 4.8 3.1 3.5  HGB 13.2 11.7* 12.2* 12.8*  HCT  39.3 35.0* 36.5* 37.5*  MCV 85.6 85.0 84.3 84.3  PLT 151 140* 139* 165   Cardiac Enzymes: No results for input(s): CKTOTAL, CKMB, CKMBINDEX, TROPONINI in the last 168 hours. BNP: Invalid input(s): POCBNP CBG:  Recent Labs Lab 05/12/17 0725 05/12/17 1114 05/12/17 1632 05/12/17 2049 05/13/17 0735  GLUCAP 120* 164* 149* 138* 116*   D-Dimer No results for input(s): DDIMER in the last 72 hours. Hgb A1c No results for input(s): HGBA1C in the last 72 hours. Lipid Profile No results for input(s): CHOL, HDL, LDLCALC, TRIG, CHOLHDL, LDLDIRECT in the last 72 hours. Thyroid function studies No results for input(s): TSH, T4TOTAL, T3FREE, THYROIDAB in the last 72 hours.  Invalid input(s): FREET3 Anemia work up No results for input(s): VITAMINB12, FOLATE, FERRITIN, TIBC, IRON, RETICCTPCT in the last 72 hours. Urinalysis No results found for: COLORURINE, APPEARANCEUR, LABSPEC, PHURINE, GLUCOSEU, HGBUR, BILIRUBINUR, KETONESUR, PROTEINUR, UROBILINOGEN, NITRITE, LEUKOCYTESUR Sepsis Labs Invalid input(s): PROCALCITONIN,  WBC,  LACTICIDVEN Microbiology Recent Results (from the past 240 hour(s))  Blood culture (routine x 2)     Status: None   Collection Time: 05/07/17  1:37 PM  Result Value Ref Range Status   Specimen Description BLOOD LEFT FOREARM  Final   Special Requests   Final    BOTTLES DRAWN AEROBIC AND ANAEROBIC Blood Culture results may not be optimal due to an inadequate volume of blood received in culture bottles   Culture NO GROWTH 5 DAYS  Final   Report Status 05/12/2017 FINAL  Final  Blood culture (routine x 2)     Status: None   Collection Time: 05/07/17  1:41 PM  Result Value Ref Range Status   Specimen Description BLOOD RIGHT ARM  Final   Special Requests   Final    BOTTLES DRAWN AEROBIC AND  ANAEROBIC Blood Culture adequate volume   Culture NO GROWTH 5 DAYS  Final   Report Status 05/12/2017 FINAL  Final    Time coordinating discharge: 34  minutes  SIGNED:  Standley Dakins, MD  [facility] 05/13/2017, 9:44 AM Pager [phone]  If 7PM-7AM, please contact night-coverage [email or web address] Password [email or web address]

## 2017-05-13 NOTE — Discharge Instructions (Signed)
Follow with Primary MD  Patient, No Pcp Per  and other consultant's as instructed your Hospitalist MD ° °Please get a complete blood count and chemistry panel checked by your Primary MD at your next visit, and again as instructed by your Primary MD. ° °Get Medicines reviewed and adjusted: °Please take all your medications with you for your next visit with your Primary MD ° °Laboratory/radiological data: °Please request your Primary MD to go over all hospital tests and procedure/radiological results at the follow up, please ask your Primary MD to get all Hospital records sent to his/her office. ° °In some cases, they will be blood work, cultures and biopsy results pending at the time of your discharge. Please request that your primary care M.D. follows up on these results. ° °Also Note the following: °If you experience worsening of your admission symptoms, develop shortness of breath, life threatening emergency, suicidal or homicidal thoughts you must seek medical attention immediately by calling 911 or calling your MD immediately  if symptoms less severe. ° °You must read complete instructions/literature along with all the possible adverse reactions/side effects for all the Medicines you take and that have been prescribed to you. Take any new Medicines after you have completely understood and accpet all the possible adverse reactions/side effects.  ° °Do not drive when taking Pain medications or sleeping medications (Benzodaizepines) ° °Do not take more than prescribed Pain, Sleep and Anxiety Medications. It is not advisable to combine anxiety,sleep and pain medications without talking with your primary care practitioner ° °Special Instructions: If you have smoked or chewed Tobacco  in the last 2 yrs please stop smoking, stop any regular Alcohol  and or any Recreational drug use. ° °Wear Seat belts while driving. ° °Please note: °You were cared for by a hospitalist during your hospital stay. Once you are discharged,  your primary care physician will handle any further medical issues. Please note that NO REFILLS for any discharge medications will be authorized once you are discharged, as it is imperative that you return to your primary care physician (or establish a relationship with a primary care physician if you do not have one) for your post hospital discharge needs so that they can reassess your need for medications and monitor your lab values. ° ° ° ° °

## 2017-05-13 NOTE — Progress Notes (Signed)
AVS discussed with patient and family.  Informed that new Rx was sent to Pharmacy.   Pt voiced understanding.  Answered all questions.  Pt taken down by tech.  Pt stable upon discharge.

## 2017-05-13 NOTE — Plan of Care (Signed)
Problem: Health Behavior/Discharge Planning: Goal: Ability to manage health-related needs will improve Outcome: Progressing Discuss  Management for  health-related needs and their improvement

## 2017-05-13 NOTE — Care Management Note (Signed)
Case Management Note  Patient Details  Name: Romeo RabonJohn Dunlap MRN: [id number] Date of Birth: 07/24/1965  Expected Discharge Date:  05/13/17               Expected Discharge Plan:  Home/Self Care  In-House Referral:  NA  Discharge planning Services  CM Consult  Post Acute Care Choice:  NA Choice offered to:  NA  Status of Service:  Completed, signed off  If discussed at Long Length of Stay Meetings, dates discussed:  05/13/2017  Additional Comments: Discharged home today with self care. Pt's abx are inexpensive, pt can afford to pay for them OOP. CM has signed consent to fax DC summary to [facility]. CM will do this if/when pt contacts [facility].   Malcolm MetroChildress, Mianna Iezzi Demske, RN 05/13/2017, 12:02 PM

## 2018-07-26 IMAGING — US US EXTREM LOW VENOUS BILAT
1 series · 13 of 24 positions shown · non-contrast
Comparison: None.

CLINICAL DATA: Left lower extremity redness and edema



[Series 1: us extrem low venous bilat · 0.09mm/px · 13 of 63 slices shown]
[im 1/63]
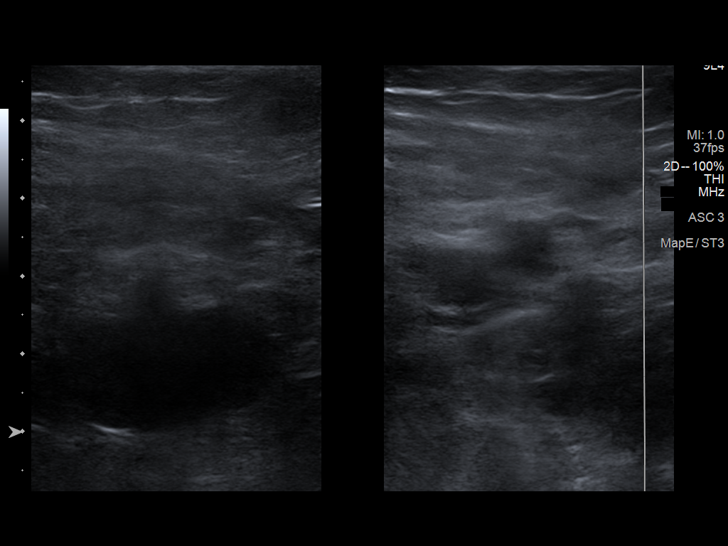
[im 6/63]
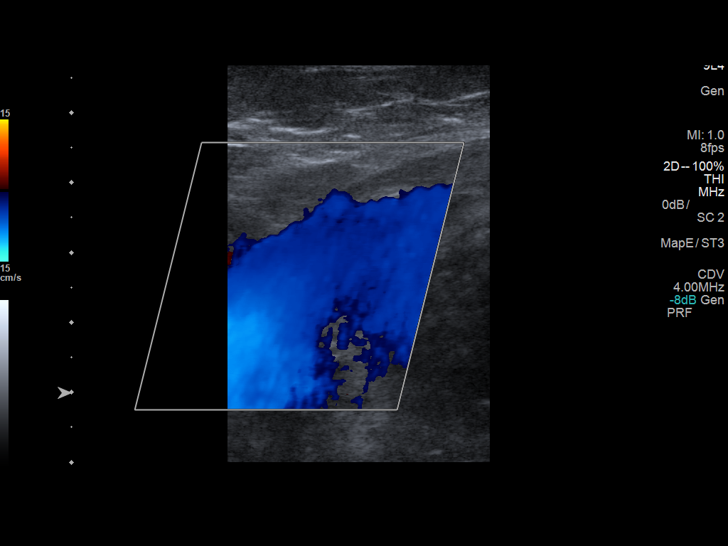
[im 11/63]
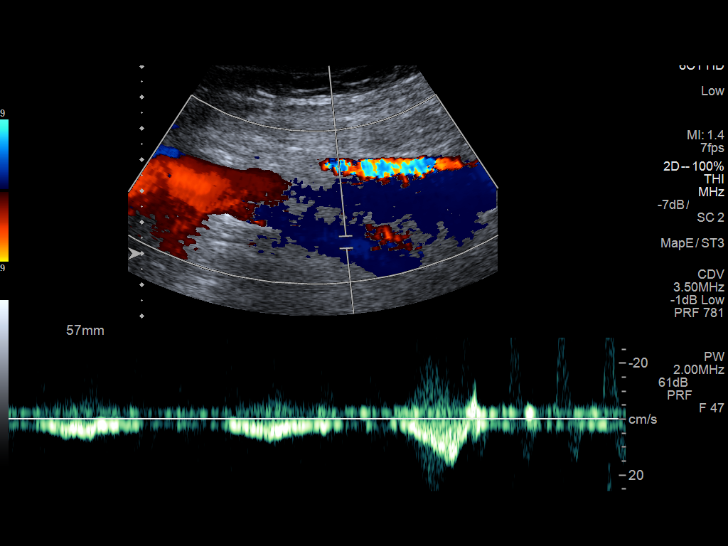
[im 17/63]
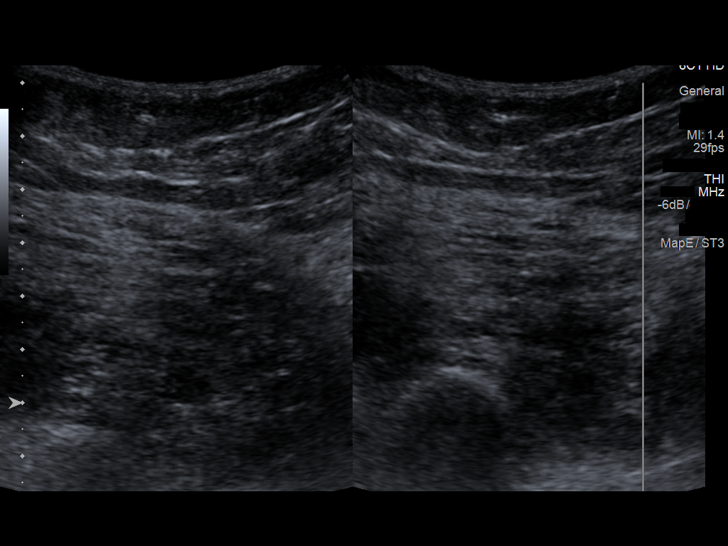
[im 22/63]
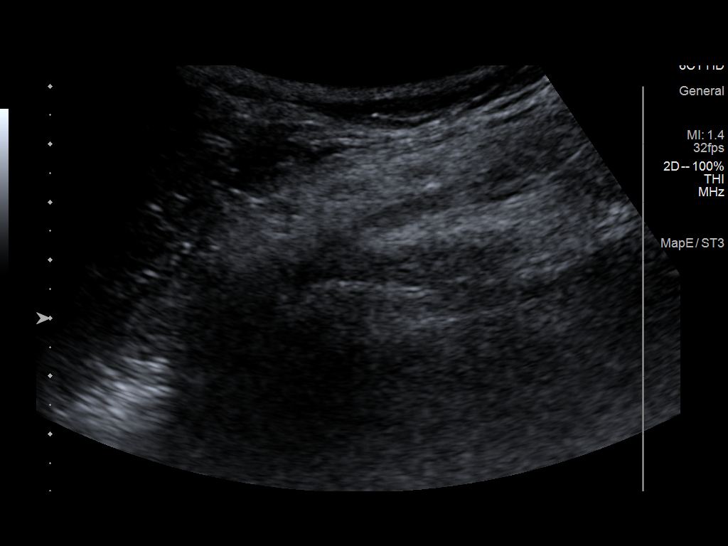
[im 27/63]
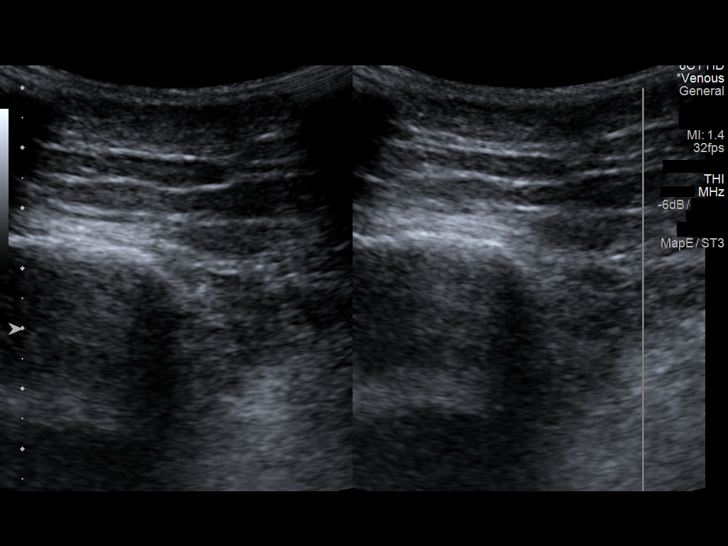
[im 33/63]
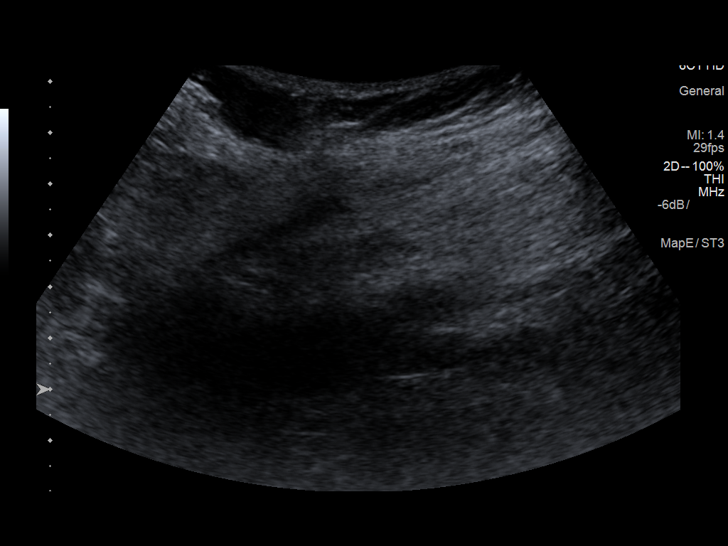
[im 36/63]
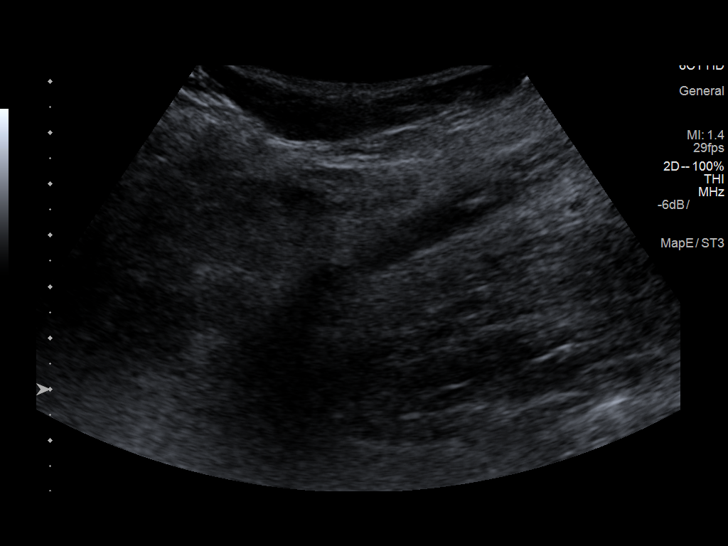
[im 41/63]
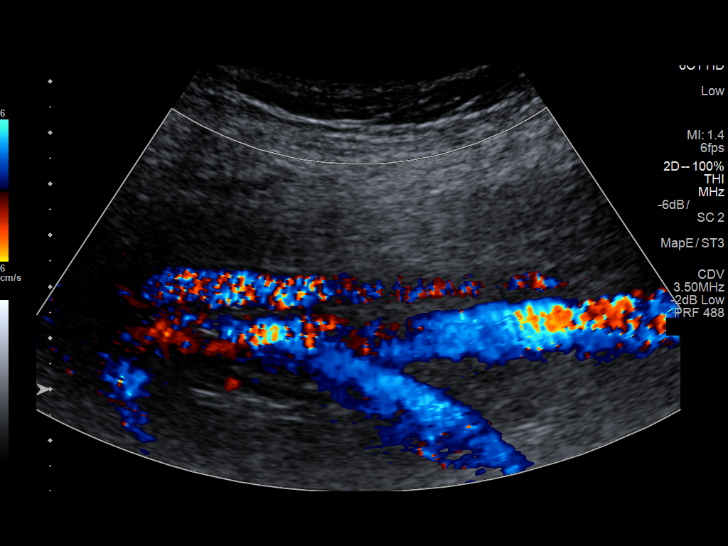
[im 46/63]
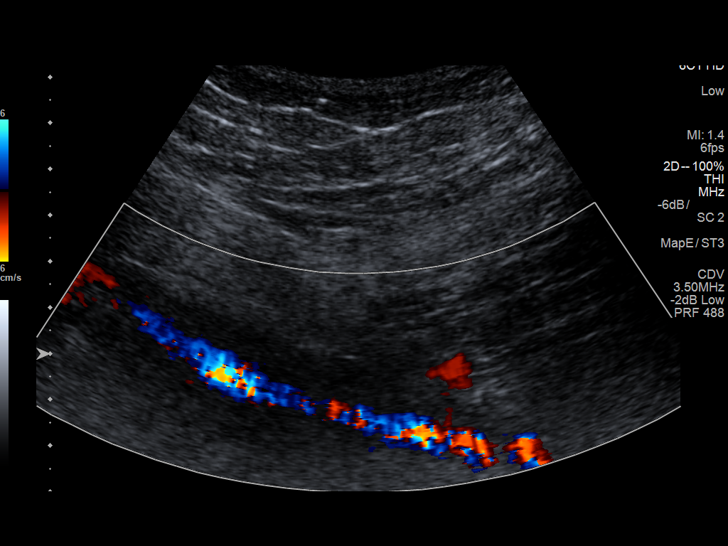
[im 52/63]
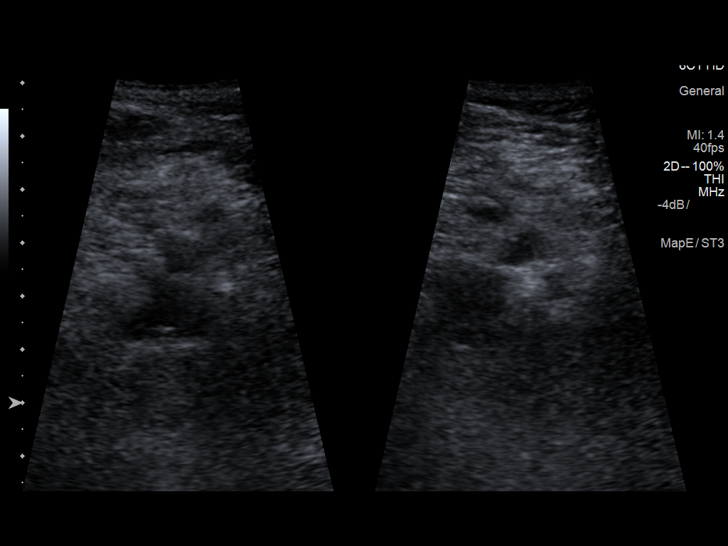
[im 57/63]
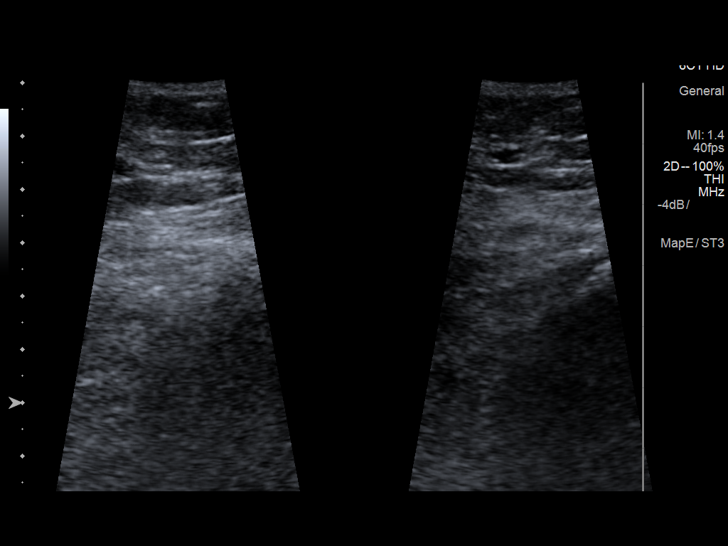
[im 63/63]
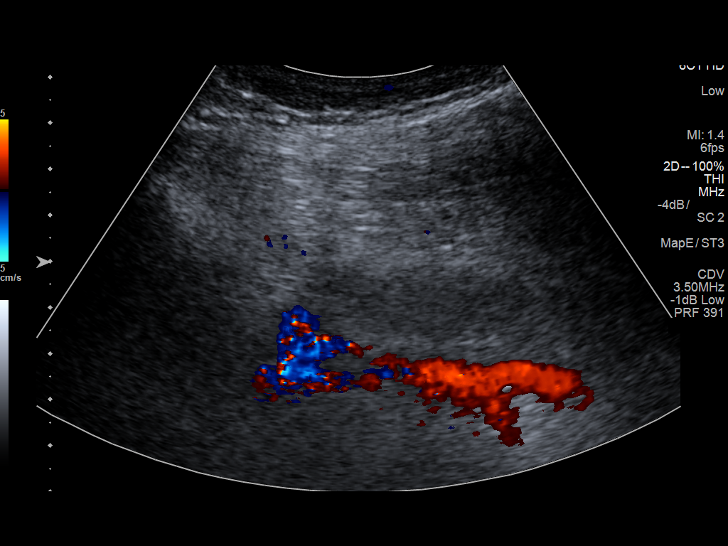

[13 of 24 positions shown; findings below may reference images not displayed]

FINDINGS: RIGHT LOWER EXTREMITY

Common Femoral Vein: No evidence of thrombus. Normal
compressibility, respiratory phasicity and response to augmentation.

Saphenofemoral Junction: No evidence of thrombus. Normal
compressibility and flow on color Doppler imaging.

Profunda Femoral Vein: No evidence of thrombus. Normal
compressibility and flow on color Doppler imaging.

Femoral Vein: No evidence of thrombus. Normal compressibility,
respiratory phasicity and response to augmentation.

Popliteal Vein: No evidence of thrombus. Normal compressibility,
respiratory phasicity and response to augmentation.

Calf Veins: No evidence of thrombus. Normal compressibility and flow
on color Doppler imaging.

Superficial Great Saphenous Vein: No evidence of thrombus. Normal
compressibility.

Venous Reflux:  None.

Other Findings:  None.

LEFT LOWER EXTREMITY

Common Femoral Vein: No evidence of thrombus. Normal
compressibility, respiratory phasicity and response to augmentation.

Saphenofemoral Junction: No evidence of thrombus. Normal
compressibility and flow on color Doppler imaging.

Profunda Femoral Vein: No evidence of thrombus. Normal
compressibility and flow on color Doppler imaging.

Femoral Vein: No evidence of thrombus. Normal compressibility,
respiratory phasicity and response to augmentation.

Popliteal Vein: No evidence of thrombus. Normal compressibility,
respiratory phasicity and response to augmentation.

Calf Veins: No evidence of thrombus. Normal compressibility and flow
on color Doppler imaging.

Superficial Great Saphenous Vein: No evidence of thrombus. Normal
compressibility.

Venous Reflux:  None.

Other Findings:  None.
IMPRESSION: No evidence of deep venous thrombosis.
